# Patient Record
Sex: Female | Born: 1985 | Race: Black or African American | Hispanic: No | Marital: Single | Smoking: Former smoker
Health system: Southern US, Community
[De-identification: ages and names within clinical notes are randomized; demographics above are authoritative.]

## PROBLEM LIST (undated history)

## (undated) DIAGNOSIS — F410 Panic disorder [episodic paroxysmal anxiety] without agoraphobia: Secondary | ICD-10-CM

## (undated) DIAGNOSIS — D649 Anemia, unspecified: Secondary | ICD-10-CM

## (undated) DIAGNOSIS — J45909 Unspecified asthma, uncomplicated: Secondary | ICD-10-CM

## (undated) HISTORY — PX: OTHER SURGICAL HISTORY: SHX169

---
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
## 2004-07-01 ENCOUNTER — Emergency Department (HOSPITAL_COMMUNITY): Admission: EM | Admit: 2004-07-01 | Discharge: 2004-07-01 | Payer: Self-pay | Admitting: Family Medicine

## 2004-07-05 ENCOUNTER — Ambulatory Visit: Payer: Self-pay | Admitting: Obstetrics and Gynecology

## 2004-07-06 ENCOUNTER — Inpatient Hospital Stay (HOSPITAL_COMMUNITY): Admission: AD | Admit: 2004-07-06 | Discharge: 2004-07-06 | Payer: Self-pay | Admitting: *Deleted

## 2004-09-11 ENCOUNTER — Inpatient Hospital Stay (HOSPITAL_COMMUNITY): Admission: AD | Admit: 2004-09-11 | Discharge: 2004-09-11 | Payer: Self-pay | Admitting: *Deleted

## 2004-10-10 ENCOUNTER — Emergency Department (HOSPITAL_COMMUNITY): Admission: EM | Admit: 2004-10-10 | Discharge: 2004-10-11 | Payer: Self-pay | Admitting: Emergency Medicine

## 2004-11-19 ENCOUNTER — Inpatient Hospital Stay (HOSPITAL_COMMUNITY): Admission: AD | Admit: 2004-11-19 | Discharge: 2004-11-19 | Payer: Self-pay | Admitting: Obstetrics and Gynecology

## 2004-12-08 ENCOUNTER — Emergency Department (HOSPITAL_COMMUNITY): Admission: EM | Admit: 2004-12-08 | Discharge: 2004-12-08 | Payer: Self-pay | Admitting: Emergency Medicine

## 2005-01-03 ENCOUNTER — Ambulatory Visit: Payer: Self-pay | Admitting: *Deleted

## 2005-01-03 ENCOUNTER — Inpatient Hospital Stay (HOSPITAL_COMMUNITY): Admission: AD | Admit: 2005-01-03 | Discharge: 2005-01-03 | Payer: Self-pay | Admitting: Obstetrics & Gynecology

## 2005-01-30 ENCOUNTER — Inpatient Hospital Stay (HOSPITAL_COMMUNITY): Admission: AD | Admit: 2005-01-30 | Discharge: 2005-01-30 | Payer: Self-pay | Admitting: Obstetrics and Gynecology

## 2005-01-30 ENCOUNTER — Ambulatory Visit: Payer: Self-pay | Admitting: *Deleted

## 2005-03-05 ENCOUNTER — Inpatient Hospital Stay (HOSPITAL_COMMUNITY): Admission: AD | Admit: 2005-03-05 | Discharge: 2005-03-10 | Payer: Self-pay | Admitting: *Deleted

## 2005-03-05 ENCOUNTER — Ambulatory Visit: Payer: Self-pay | Admitting: Obstetrics and Gynecology

## 2005-04-09 ENCOUNTER — Ambulatory Visit: Payer: Self-pay | Admitting: *Deleted

## 2005-04-09 ENCOUNTER — Inpatient Hospital Stay (HOSPITAL_COMMUNITY): Admission: AD | Admit: 2005-04-09 | Discharge: 2005-04-09 | Payer: Self-pay | Admitting: Family Medicine

## 2005-04-28 ENCOUNTER — Ambulatory Visit (HOSPITAL_COMMUNITY): Admission: AD | Admit: 2005-04-28 | Discharge: 2005-04-28 | Payer: Self-pay | Admitting: Obstetrics & Gynecology

## 2005-05-08 ENCOUNTER — Inpatient Hospital Stay (HOSPITAL_COMMUNITY): Admission: AD | Admit: 2005-05-08 | Discharge: 2005-05-11 | Payer: Self-pay | Admitting: Obstetrics & Gynecology

## 2005-07-04 ENCOUNTER — Emergency Department (HOSPITAL_COMMUNITY): Admission: EM | Admit: 2005-07-04 | Discharge: 2005-07-04 | Payer: Self-pay | Admitting: Emergency Medicine

## 2006-01-05 ENCOUNTER — Emergency Department (HOSPITAL_COMMUNITY): Admission: EM | Admit: 2006-01-05 | Discharge: 2006-01-05 | Payer: Self-pay | Admitting: Emergency Medicine

## 2006-04-15 IMAGING — CR DG CHEST 2V
2 series · 2 of 2 positions shown · non-contrast
Comparison: None

CLINICAL DATA: 34 weeks pregnant. Flulike symptoms, fever, question pneumonia

CHEST - 2 VIEW:

[view not recorded (1 of 2)]
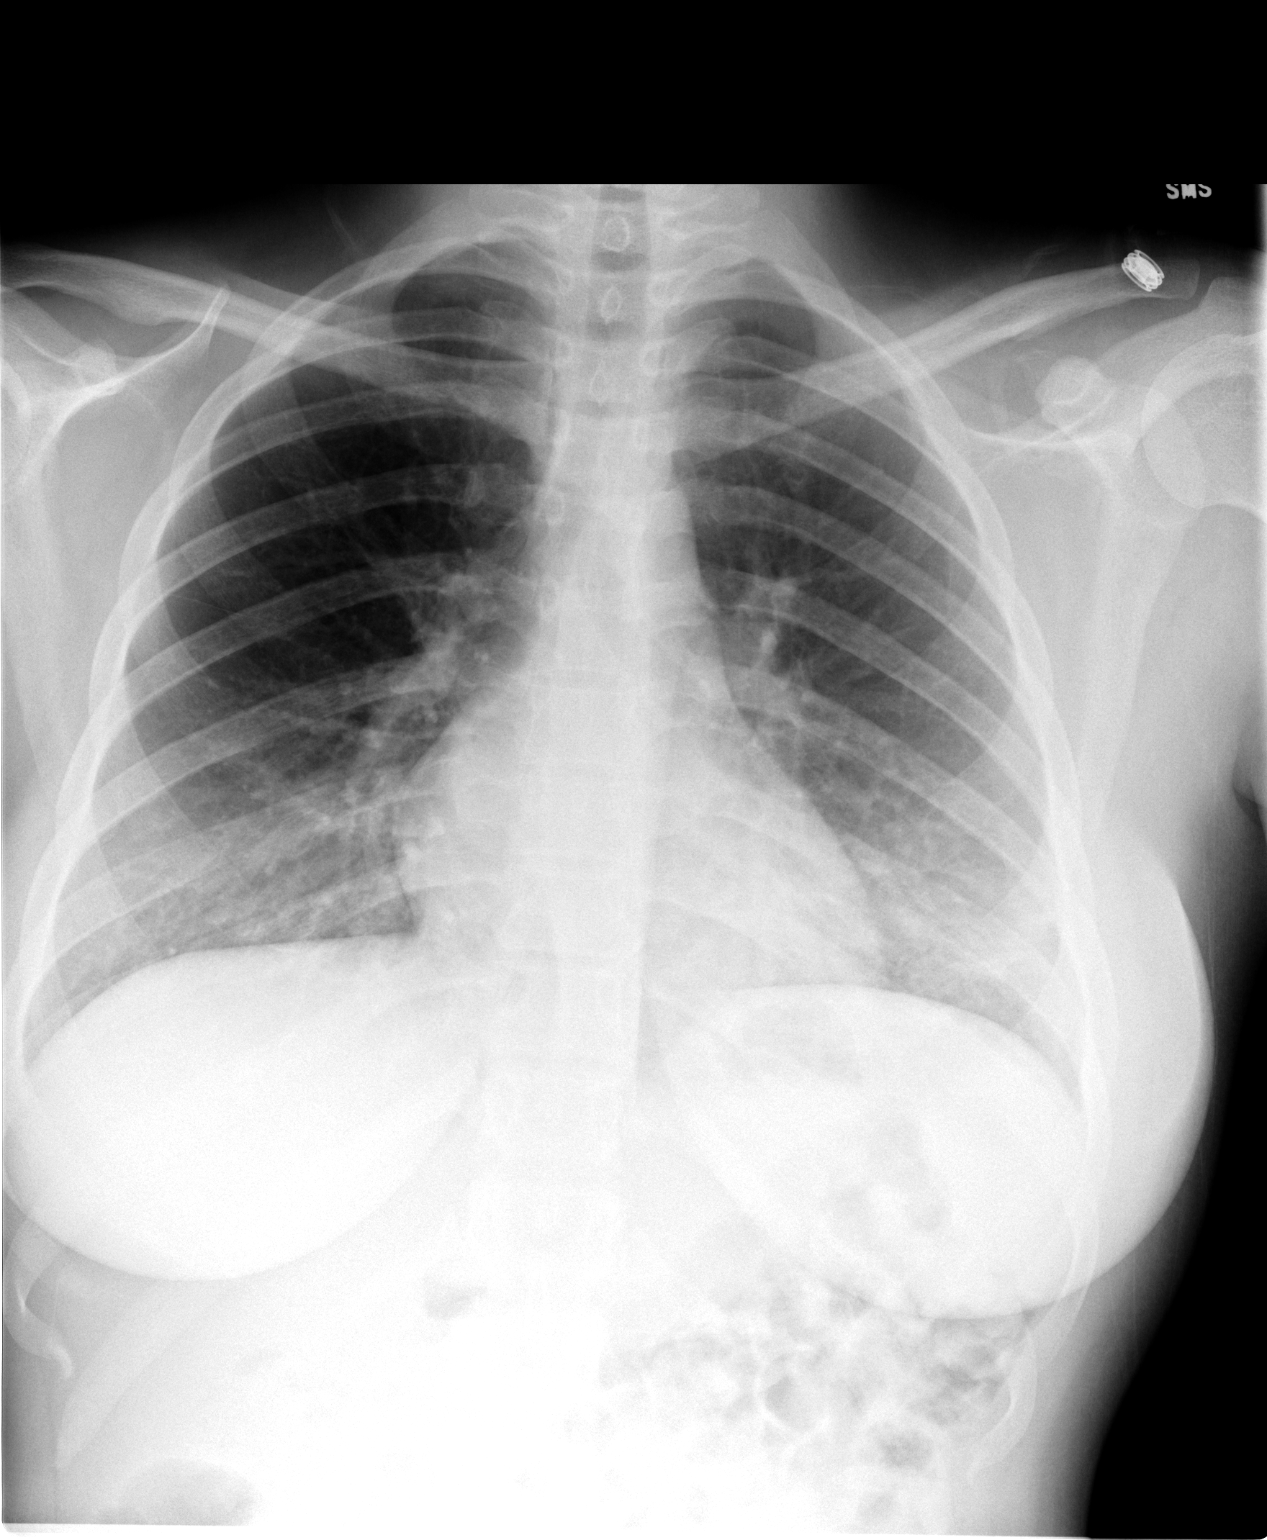

[view not recorded (2 of 2)]
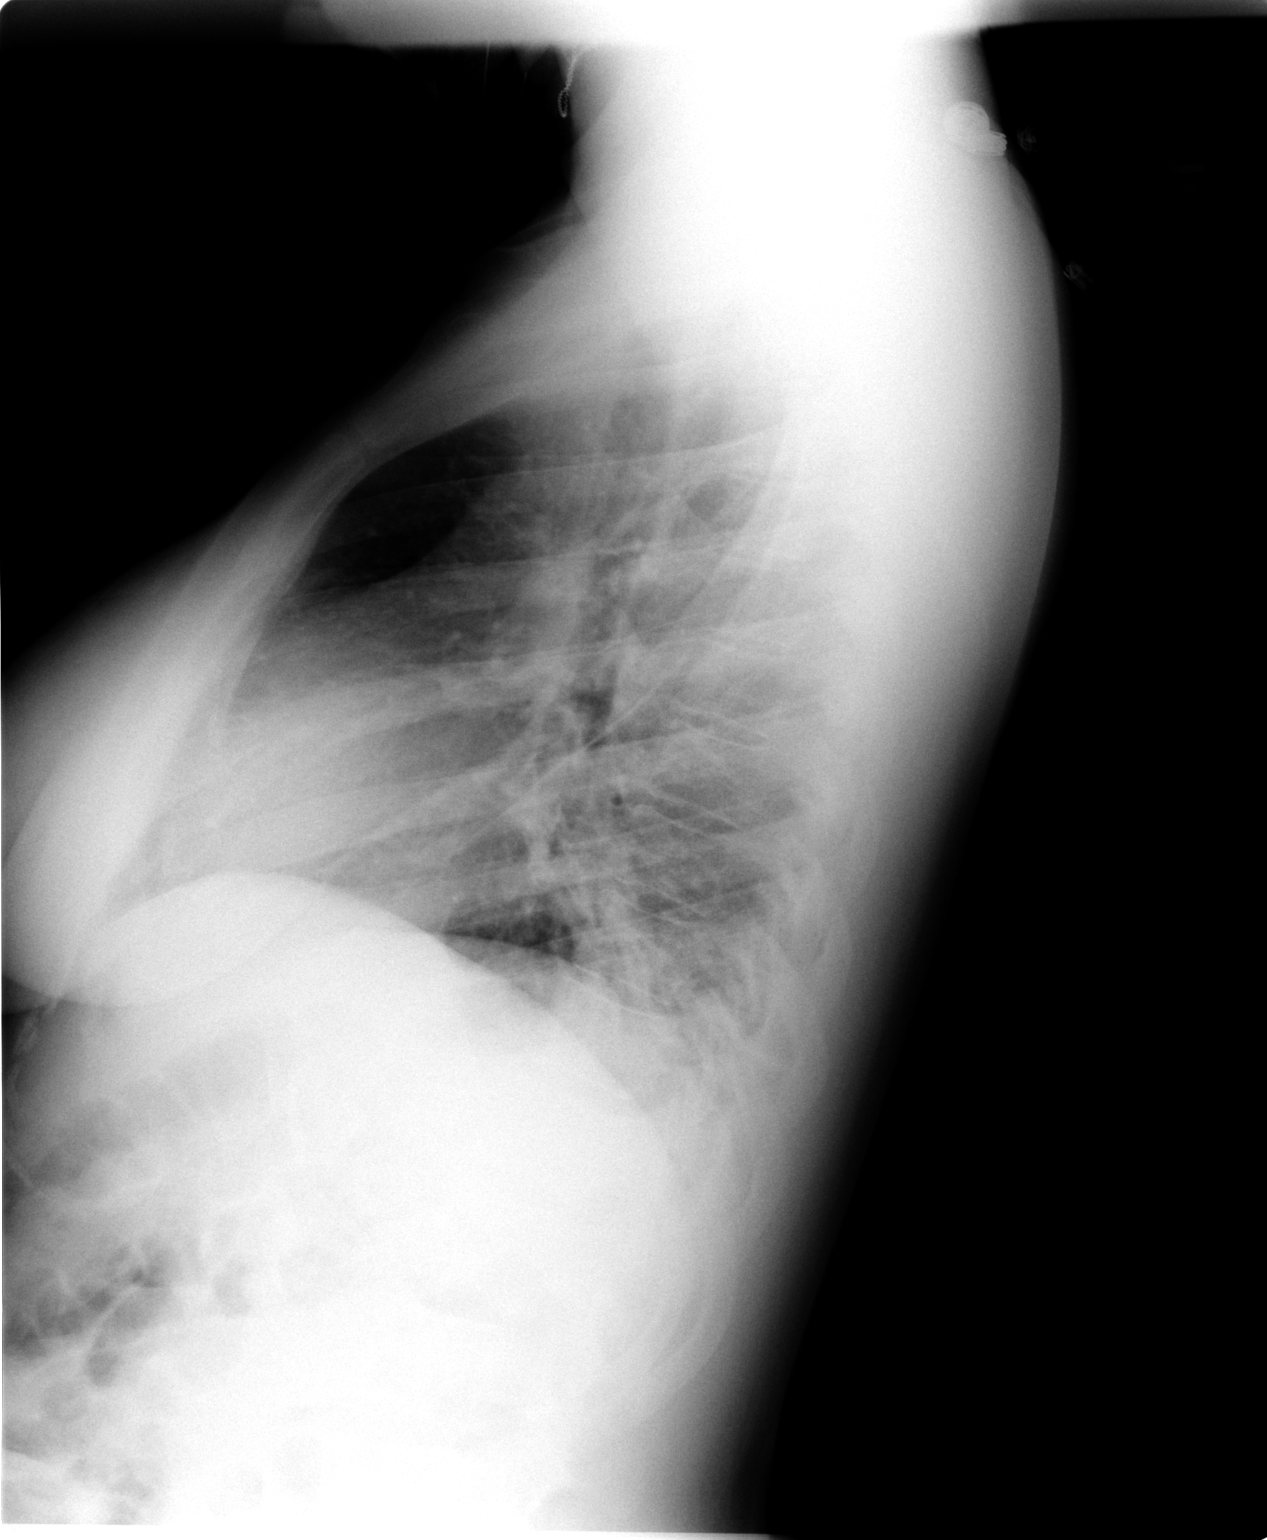

[2 of 2 positions shown; findings below may reference images not displayed]

FINDINGS: Heart and mediastinal contours are within normal limits. There are
increased markings in the bases bilaterally, but more confluent opacity noted in
the left lower lobe posteriorly, question early left lower lobe pneumonia. No
effusions.
IMPRESSION: Increased markings in the bases, but somewhat more confluent opacity in the left
lower lobe posteriorly, question early pneumonia.

## 2006-07-25 ENCOUNTER — Emergency Department (HOSPITAL_COMMUNITY): Admission: EM | Admit: 2006-07-25 | Discharge: 2006-07-25 | Payer: Self-pay | Admitting: *Deleted

## 2006-08-06 ENCOUNTER — Emergency Department (HOSPITAL_COMMUNITY): Admission: EM | Admit: 2006-08-06 | Discharge: 2006-08-07 | Payer: Self-pay | Admitting: Emergency Medicine

## 2008-04-03 ENCOUNTER — Emergency Department (HOSPITAL_COMMUNITY): Admission: EM | Admit: 2008-04-03 | Discharge: 2008-04-03 | Payer: Self-pay | Admitting: Emergency Medicine

## 2008-06-26 ENCOUNTER — Emergency Department (HOSPITAL_COMMUNITY): Admission: EM | Admit: 2008-06-26 | Discharge: 2008-06-26 | Payer: Self-pay | Admitting: Emergency Medicine

## 2008-10-31 ENCOUNTER — Emergency Department (HOSPITAL_COMMUNITY): Admission: EM | Admit: 2008-10-31 | Discharge: 2008-10-31 | Payer: Self-pay | Admitting: Emergency Medicine

## 2008-11-02 ENCOUNTER — Emergency Department (HOSPITAL_COMMUNITY): Admission: EM | Admit: 2008-11-02 | Discharge: 2008-11-02 | Payer: Self-pay | Admitting: Emergency Medicine

## 2009-05-07 ENCOUNTER — Emergency Department (HOSPITAL_COMMUNITY): Admission: EM | Admit: 2009-05-07 | Discharge: 2009-05-07 | Payer: Self-pay | Admitting: Emergency Medicine

## 2009-07-18 ENCOUNTER — Emergency Department (HOSPITAL_COMMUNITY): Admission: EM | Admit: 2009-07-18 | Discharge: 2009-07-18 | Payer: Self-pay | Admitting: Emergency Medicine

## 2010-04-12 ENCOUNTER — Emergency Department (HOSPITAL_COMMUNITY)
Admission: EM | Admit: 2010-04-12 | Discharge: 2010-04-12 | Disposition: A | Discharge status: Home / Self Care | Payer: Self-pay | Attending: Emergency Medicine | Admitting: Emergency Medicine

## 2010-04-12 DIAGNOSIS — K5289 Other specified noninfective gastroenteritis and colitis: Secondary | ICD-10-CM | POA: Insufficient documentation

## 2010-04-12 DIAGNOSIS — R6883 Chills (without fever): Secondary | ICD-10-CM | POA: Insufficient documentation

## 2010-04-12 LAB — URINALYSIS, ROUTINE W REFLEX MICROSCOPIC
Bilirubin Urine: NEGATIVE
Hgb urine dipstick: NEGATIVE
Ketones, ur: NEGATIVE mg/dL
Leukocytes, UA: NEGATIVE
Specific Gravity, Urine: 1.015 (ref 1.005–1.030)
Urine Glucose, Fasting: NEGATIVE mg/dL
pH: 8.5 — ABNORMAL HIGH (ref 5.0–8.0)

## 2010-04-12 LAB — CBC
HCT: 39 % (ref 36.0–46.0)
MCHC: 34.9 g/dL (ref 30.0–36.0)
Platelets: 224 10*3/uL (ref 150–400)
RBC: 4.28 MIL/uL (ref 3.87–5.11)
RDW: 12.7 % (ref 11.5–15.5)
WBC: 8.6 10*3/uL (ref 4.0–10.5)

## 2010-04-12 LAB — DIFFERENTIAL
Basophils Absolute: 0 10*3/uL (ref 0.0–0.1)
Eosinophils Relative: 1 % (ref 0–5)
Lymphocytes Relative: 13 % (ref 12–46)
Lymphs Abs: 1.2 10*3/uL (ref 0.7–4.0)
Monocytes Absolute: 0.4 10*3/uL (ref 0.1–1.0)
Neutro Abs: 6.9 10*3/uL (ref 1.7–7.7)
Neutrophils Relative %: 81 % — ABNORMAL HIGH (ref 43–77)

## 2010-04-12 LAB — URINE MICROSCOPIC-ADD ON

## 2010-04-12 LAB — BASIC METABOLIC PANEL
BUN: 10 mg/dL (ref 6–23)
CO2: 27 mEq/L (ref 19–32)
Calcium: 9.5 mg/dL (ref 8.4–10.5)
Chloride: 103 mEq/L (ref 96–112)
Creatinine, Ser: 0.84 mg/dL (ref 0.4–1.2)
GFR calc Af Amer: >60 mL/min (ref >60)
GFR calc non Af Amer: >60 mL/min (ref >60)
Glucose, Bld: 89 mg/dL (ref 70–99)
Potassium: 4 mEq/L (ref 3.5–5.1)
Sodium: 138 mEq/L (ref 135–145)

## 2010-04-12 LAB — GLUCOSE, CAPILLARY: Glucose-Capillary: 90 mg/dL (ref 70–99)

## 2010-04-25 ENCOUNTER — Emergency Department (HOSPITAL_COMMUNITY)
Admission: EM | Admit: 2010-04-25 | Discharge: 2010-04-25 | Disposition: A | Discharge status: Home / Self Care | Payer: Self-pay | Attending: Emergency Medicine | Admitting: Emergency Medicine

## 2010-04-25 DIAGNOSIS — R229 Localized swelling, mass and lump, unspecified: Secondary | ICD-10-CM | POA: Insufficient documentation

## 2010-04-25 DIAGNOSIS — R209 Unspecified disturbances of skin sensation: Secondary | ICD-10-CM | POA: Insufficient documentation

## 2010-05-25 LAB — URINE MICROSCOPIC-ADD ON

## 2010-05-25 LAB — PREGNANCY, URINE: Preg Test, Ur: NEGATIVE

## 2010-05-25 LAB — URINALYSIS, ROUTINE W REFLEX MICROSCOPIC
Glucose, UA: NEGATIVE mg/dL
Ketones, ur: 15 mg/dL — AB
Leukocytes, UA: NEGATIVE
Nitrite: NEGATIVE
Protein, ur: 30 mg/dL — AB
Specific Gravity, Urine: >1.03 — ABNORMAL HIGH (ref 1.005–1.030)
Urobilinogen, UA: 0.2 mg/dL (ref 0.0–1.0)
pH: 6 (ref 5.0–8.0)

## 2010-06-09 LAB — CULTURE, ROUTINE-ABSCESS

## 2010-06-13 LAB — PREGNANCY, URINE: Preg Test, Ur: NEGATIVE

## 2010-06-13 LAB — URINALYSIS, ROUTINE W REFLEX MICROSCOPIC
Bilirubin Urine: NEGATIVE
Glucose, UA: NEGATIVE mg/dL
Ketones, ur: NEGATIVE mg/dL
Leukocytes, UA: NEGATIVE
Nitrite: NEGATIVE
Protein, ur: NEGATIVE mg/dL
Specific Gravity, Urine: 1.01 (ref 1.005–1.030)
Urobilinogen, UA: 0.2 mg/dL (ref 0.0–1.0)
pH: 6.5 (ref 5.0–8.0)

## 2010-06-13 LAB — URINE MICROSCOPIC-ADD ON

## 2010-06-13 LAB — BASIC METABOLIC PANEL
BUN: 8 mg/dL (ref 6–23)
CO2: 30 mEq/L (ref 19–32)
Calcium: 9.2 mg/dL (ref 8.4–10.5)
Chloride: 104 mEq/L (ref 96–112)
Creatinine, Ser: 0.75 mg/dL (ref 0.4–1.2)
GFR calc Af Amer: >60 mL/min (ref >60)
GFR calc non Af Amer: >60 mL/min (ref >60)
Glucose, Bld: 83 mg/dL (ref 70–99)
Potassium: 3.9 mEq/L (ref 3.5–5.1)
Sodium: 138 mEq/L (ref 135–145)

## 2010-06-13 LAB — CBC
HCT: 37.3 % (ref 36.0–46.0)
Hemoglobin: 12.9 g/dL (ref 12.0–15.0)
MCHC: 34.5 g/dL (ref 30.0–36.0)
MCV: 92.5 fL (ref 78.0–100.0)
Platelets: 242 10*3/uL (ref 150–400)
RBC: 4.03 MIL/uL (ref 3.87–5.11)
RDW: 13.2 % (ref 11.5–15.5)
WBC: 5.4 10*3/uL (ref 4.0–10.5)

## 2010-06-13 LAB — ABO/RH: ABO/RH(D): B POS

## 2010-06-13 LAB — HCG, QUANTITATIVE, PREGNANCY: hCG, Beta Chain, Quant, S: <2 m[IU]/mL (ref <5)

## 2010-07-20 NOTE — H&P (Signed)
NAMESHANTAY, SONN                        ACCOUNT NO.:  [id number]   MEDICAL RECORD NO.:  [id number]                  PATIENT TYPE:   LOCATION:                                       FACILITY:   PHYSICIAN:  Lazaro Arms, M.D.                DATE OF BIRTH:  04-Feb-1986   DATE OF ADMISSION:  04/20/2003  DATE OF DISCHARGE:                                HISTORY & PHYSICAL   CHIEF COMPLAINT:  Induction of labor due to extreme discomfort.   HISTORY OF PRESENT ILLNESS:  Kathy Robertson is an 25 year old, gravida 4, para 2,  AB 1, living 2, with an EDC of April 27, 2003, based on a 30-week  ultrasound.  She was taking Ovcon 35 when she unknowing became pregnant and  began prenatal care at approximately [redacted] weeks gestation when she felt the  baby move.  She has had regular visits since beginning care.  Blood  pressures have been in the 100s-120s/50s-70s.   PRENATAL LABORATORY DATA:  Blood type B positive.  Rubella is immune.  HBSAG, HIV, RPR, gonorrhea, chlamydia, and GBS are all negative.  Sickle  cell screen is also negative.  One-hour GTT was normal at 79.  Total weight  gain has been 2 pounds since beginning care with appropriate fundal height  growth.   PAST MEDICAL HISTORY:  Noncontributory.   PAST SURGICAL HISTORY:  Noncontributory.   ALLERGIES:  No known drug allergies.   SOCIAL HISTORY:  She is single.  The father of the baby is supportive.  This  is the same father of all of her babies.  She is a Holiday representative at [employer].   OBSTETRICAL HISTORY:  Spontaneous AB in 2001.  In 2003, she had a vaginal  delivery of a 6 pound 10 ounce female at 40 weeks.  In 2004, she had a  spontaneous vaginal delivery of a 6 pound 6 ounce female at 42 weeks with no  complications.   PHYSICAL EXAMINATION:  HEENT:  Within normal limits.  LUNGS:  Clear.  HEART:  Regular rate and rhythm.  ABDOMEN:  Soft and nontender.  Fundal height 39 cm.  Estimated weight about  6 pounds.  PELVIC:   The cervix is 2 cm, soft, long, and -2 station.  Vertex  presentation.  EXTREMITIES:  The legs are negative.   IMPRESSION:  Intrauterine pregnancy at 39 weeks.  Elective induction of  labor due to extreme maternal discomfort.  The risks have been explained to  the patient in that the same risks apply with induction of labor as you have  with spontaneous labor.  She accepts these risks, which is to proceed with  the elective induction of labor.     _____________________________________  ___________________________________________  Jacklyn Shell, C.N.M.        Lazaro Arms, M.D.   FC/MEDQ  D:  04/19/2003  T:  04/19/2003  Job:  [id number]  cc:   [facility]

## 2010-07-20 NOTE — H&P (Signed)
NAMEMERCADES, BAJAJ              ACCOUNT NO.:  [id number]   MEDICAL RECORD NO.:  [id number]          PATIENT TYPE:  INP   LOCATION:  A411                          FACILITY:  [facility]   PHYSICIAN:  Tilda Burrow, M.D. DATE OF BIRTH:  Jun 23, 1985   DATE OF ADMISSION:  05/08/2005  DATE OF DISCHARGE:  03/10/2007LH                                HISTORY & PHYSICAL   ADMITTING DIAGNOSES:  1.  Pregnancy, [redacted] weeks gestation.  2.  Medically indicated induction of labor.  3.  Minimal prenatal care this pregnancy.   HISTORY OF PRESENT ILLNESS:  This 25 year old female, gravida 5, para 3,  AB1, three living children, was admitted to [facility] for  induction of labor at 41 weeks by the best criteria available.  The patient  has sought minimal prenatal care this pregnancy and was then seen three  times, just visits to [facility] or [facility].  Records  available include blood type B-positive, urine drug screen negative,  hemoglobin 9, hematocrit 27 in November 2006, hepatitis, HIV, RPR, GC and  Chlamydia all negative.  Glucose _tolerance test__ 109 mg%.  The patient is  41 weeks 1 day by best available criteria.  This included an ultrasound at  [redacted] weeks gestation performed at [facility].  The prenatal records had  been obtained from February 2007 at [facility] where patient had a  prenatal assessment.  The pregnancy was notable for spotting x1 episode.   PAST MEDICAL HISTORY:  Benign.   RISK FACTORS IDENTIFIED DURING PREGNANCY:  1.  Need for influenza injection.  2.  History of THC use identified at initial assessment.   PHYSICAL EXAMINATION ON ADMISSION:  A term-sized fetus in the vertex  presentation.  The cervix 2 cm thick and high, vertex presentation when  scheduled for admission after assessment on May 07, 2005.  Admitting weight  179, blood pressure 110/60.  General examination when admitted and assessed  by Zerita Boers, CNM, includes  term-sized fetus, vertex presentation with  cervical exam showing the cervix to be 2 cm.   PLAN:  __foley Bulb__ cervical ripening, Pitocin induction of labor.      Tilda Burrow, M.D.  Electronically Signed     JVF/MEDQ  D:  06/30/2005  T:  06/30/2005  Job:  [id number]

## 2010-07-20 NOTE — Discharge Summary (Signed)
NAMEFRANCYS, Kathy Robertson              ACCOUNT NO.:  [id number]   MEDICAL RECORD NO.:  [id number]          PATIENT TYPE:  INP   LOCATION:  9307                          [facility]ACILITY:  [facility]   PHYSICIAN:  Conni Elliot, M.D.DATE OF BIRTH:  07-22-1985   DATE OF ADMISSION:  03/05/2005  DATE OF DISCHARGE:  03/10/2005                                 DISCHARGE SUMMARY   LABORATORY AND RADIOLOGY STUDIES:  White blood cell count on admission 7.5,  hemoglobin 10.0, platelets 195.  On the white count, there was an 85%  neutrophil.  Urine specific gravity 1.025, negative urine glucose, negative  nitrite, trace leukocyte, 0-2 white blood cells per high power field, rare  bacteria.  Microbiology revealed 20,000 colony forming units of E coli, pan  sensitive.  Influenza A positive, insulin B negative.  Sodium 138, potassium  3.9, glucose 87, bicarbonate 25, BUN 6, creatinine 0.7.  AST 11, ALT less  than 8.  Albumin 2.0.  LDH 107.  Uric acid 5.8.   Chest x-ray on March 06, 2005 revealed increased markings in the bases,  somewhat more confluent on the left lower lobe posteriorly with a question  of early pneumonia.   SUMMARY OF HOSPITAL COURSE:  This is a 25 year old G5, P3, 0-1-3, who  presented at 32 weeks with a viral syndrome.  Initially chest x-ray revealed  questionable pneumonia, that was felt to be viral versus secondary bacterial  after influenza A came back.  We started Tamiflu on March 06, 2005 and  then began Rocephin and azithromycin for the possibility of a secondary  bacterial pneumonia.  We also were giving the patient nebulized albuterol,  Atrovent.  The patient starting to have worsening oxygen saturations and  requiring larger amounts of oxygen and becoming slightly tachypneic, so she  was transferred for a very short time to the intensive care unit for closer  monitoring.  She quickly resolved after this difficulty and subsequently was  afebrile for 48 hours before discharge and  was no longer requiring any  oxygen and her saturation was 100% on room air, no longer requiring  nebulization, was taking p.o. without difficulty and asking to go home.  Throughout her admission, fetal heart tracings revealed to have fetus in no  distress and with good variability and a reactive pattern of the heart  tracing.   ACTIVITY:  Increase as tolerated.   DIET:  Increase as tolerated, should emphasize clear liquids.   FOLLOWUP CARE:  As it is Sunday, we cannot make an appointment for her, but  we have given her the telephone number and recommend that she make an  appointment at [facility], where she gets her care for this coming  Wednesday, March 13, 2005, to follow up for respiratory difficulty,  pneumonia, make sure she finishes she antibiotics and make sure she  continues to improve.      Angeline Slim, M.D.    ______________________________  Conni Elliot, M.D.   AL/MEDQ  D:  03/10/2005  T:  03/11/2005  Job:  [id number]

## 2010-07-20 NOTE — Op Note (Signed)
[facility]  Patient:    Kathy Robertson, Kathy Robertson Visit Number: [id number] MRN: [id number]          Service Type: OBS Location: 4A A420 01 Attending Physician:  Tilda Burrow Dictated by:   Zerita Boers, N.M. Admit Date:  03/23/2001 Discharge Date: 03/25/2001                             Operative Report  DELIVERY SUMMARY  Onset of labor:  March 23, 2001, at 9 a.m. Date of delivery:  March 23, 2001, at 1540 hours. Length of first-stage labor:  5 hours 25 minutes. Length of second-stage labor:  1 hour 12 minutes. Length of third-stage labor:  Five minutes.  DELIVERY NOTE:  Inda Coke had a normal spontaneous vaginal delivery of a viable female infant.  Upon delivery of the head, a tight nuchal cord was noted x 1, which was loosened, and the infant slipped through without any difficulty. Infant Apgars were 8 and 9.  Twenty units of Pitocin was used to manage third stage of labor.  Placenta was delivered via Duncans mechanism.  A three-vessel cord was noted, and membranes were intact.  Estimated blood loss was approximately 300 cc.  Perineum was intact upon inspection.  Infant and mother were stabilized in good condition without any complications and transferred up to the postpartum unit. Dictated by:   Zerita Boers, N.M. Attending Physician:  Tilda Burrow DD:  03/23/01 TD:  03/24/01 Job: [id number]

## 2010-07-20 NOTE — H&P (Signed)
NAME:  Kathy Robertson, Kathy Robertson                        ACCOUNT NO.:  [id number]   MEDICAL RECORD NO.:  [id number]                   PATIENT TYPE:  INP   LOCATION:  LDR1                                 FACILITY:  [facility]   PHYSICIAN:  Tilda Burrow, M.D.              DATE OF BIRTH:  Jun 25, 1985   DATE OF ADMISSION:  05/30/2002  DATE OF DISCHARGE:                                HISTORY & PHYSICAL   ADMITTING DIAGNOSIS:  Pregnancy, 41 weeks' gestation; latent phase labor.   HISTORY OF PRESENT ILLNESS:  This 25 year old female, gravida 3, para 1,  Ab1, LMP ? mid-May 2003, which placed menstrual EDC April 23, 2002, with  ultrasound-assigned [facility] of May 19, 2002, based on 18-week 5-day ultrasound  on December 21, 2001, is admitted at 41 weeks by that single late criteria.  The patient presented at approximately 5 p.m. to labor and delivery on  Sunday, May 30, 2002, complaining of irregular uterine contractions.  She  was found to be 3-cm dilated by Dr. Langley Gauss, covering physician, and  was admitted.  After assumption of care by me, we progressed slowly through  the night.  Labor was not augmented initially.  She has remained with  minimal contractions through the night and has a cervix that remains 3 cm,  uneffaced, -2 station with vertex presentation confirmed.  At 5:30 a.m.,  fetal heart beat-to-beat shows excellent reactivity.  Pitocin augmentation  is initiated.  Prognosis for vaginal delivery is considered good, given that  she has delivered vaginally once before after a 6-hour labor, delivering a 6-  pound 10-ounce infant.   PAST MEDICAL HISTORY:  Benign.   SURGICAL HISTORY:  Negative.   ALLERGIES:  No known drug allergies.   CONTRACEPTION:  Conceived on NuvaRing.   PHYSICAL EXAMINATION:  VITAL SIGNS:  Height 5 feet 7 inches.  Weight 215.  Blood pressure 139/76, temperature 98.4, respirations 18, pulse 85.  GENERAL:  Prenatal general exam showed a large-framed African  American  female, alert and oriented x3.  HEENT:  Pupils equal, round and reactive.  CARDIOVASCULAR:  Exam unremarkable.  ABDOMEN:  Abdominal height is 40-cm fundal height.  PELVIC:  Cervix 3, 0%, -2.   PRENATAL LABORATORY DATA:  Blood type B-positive; UDS negative; hemoglobin  12, hematocrit 36; hepatitis, HIV, GC, Chlamydia all negative; group B strep  positive with prior pregnancy.    ASSESSMENT:  1. Pregnancy, 41 weeks, by late gestational criteria, latent phase labor.  2. 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   PLAN:  Pitocin augmentation of labor, amniotomy when cervical dilation has  been achieved.                                               Tilda Burrow, M.D.    JVF/MEDQ  D:  05/31/2002  T:  05/31/2002  Job:  [id number]

## 2010-07-20 NOTE — Op Note (Signed)
   NAMEREBIE, PEALE                        ACCOUNT NO.:  [id number]   MEDICAL RECORD NO.:  [id number]                   PATIENT TYPE:  INP   LOCATION:  LDR1                                 FACILITY:  [facility]   PHYSICIAN:  Tilda Burrow, M.D.              DATE OF BIRTH:  [birth year]   DATE OF PROCEDURE:  05/31/2002  DATE OF DISCHARGE:                                  PROCEDURE NOTE   ONSET OF LABOR:  May 31, 2002, at 5 a.m.   DATE OF DELIVERY:  May 31, 2002, at 11:37 a.m.   LENGTH OF FIRST STAGE OF LABOR:  7 hours and 35 minutes.   LENGTH OF SECOND STAGE OF LABOR:  Two minutes.   LENGTH OF THIRD STAGE OF LABOR:  Three minutes.   DELIVERY NOTE:  The patient had a normal spontaneous vaginal delivery of a  viable female infant.  Upon delivery of the head, the nose and mouth were  thoroughly suctioned.  Spontaneous delivery of shoulders without  complications.  Upon delivery, the infant had a strong cry, did move all  extremities, and pinked up well.  The infant was dried.  The cord was  clamped and cut.  The infant was given to the nursery nurse for further  drying.  The third stage of labor was actively managed with 20 units of  Pitocin and 1000 mL of D5 LR at a rapid rate.  Upon inspection, the perineum  was noted to be intact.  The placenta was delivered spontaneously via  Tomasa Blase mechanism.  A three-vessel cord was noted upon inspection.  Membranes were noted to be intact upon inspection.  The estimated blood loss  was approximately 300 mL.  The infant and mother were both stabilized in  good condition.  Transferred out to the postpartum unit.     Zerita Boers, Reita Cliche, M.D.    DL/MEDQ  D:  16/12/9602  T:  05/31/2002  Job:  [id number]   cc:   [facility]

## 2010-07-20 NOTE — Group Therapy Note (Signed)
[name], [name]              ACCOUNT NO.:  [id number]   MEDICAL RECORD NO.:  [id number]          PATIENT TYPE:  WOC   LOCATION:  [facility]                   [facility]AC[facility]   PHYSICIAN:  Argentina Donovan, MD        DATE OF BIRTH:  09-Jun-1985   DATE OF SERVICE:  07/05/2004                                    CLINIC NOTE   REASON FOR VISIT:  The patient is a 25 year old gravida 3 para 3-0-0-3 who  had her last baby a year ago February and had a Mirena IUD put in place at  that time. The patient became amenorrheic 2 months ago and started having  severe lower abdominal pain consistently. She has an acutely retroverted  uterus and wants the IUD removed. She used the NuvaRing before and got  pregnant using that. We are going to substitute the oral contraceptives,  i.e. Ortho Tri-Cyclen Lo for the IUD when it is taken out. Instructions have  been given to her how to use it. Cervical cultures and Pap smear were taken  today.   EXAMINATION:  The abdomen was soft, flat, nontender. No masses, no  organomegaly. External genitalia is normal, BUS within normal limits. The  vagina is clean and well rugated. The cervix was clean and far anterior in  the vagina. The uterus is acutely retroverted and of normal size, shape,  consistency. The adnexa is normal. There is no pain on examination. The IUD  was removed with a ring forceps without incident and easily.   A prescription for Ortho Tri-Cyclen for 1 year has been given to the  patient. She has been instructed if pain persists to come back.      PR/MEDQ  D:  07/05/2004  T:  07/05/2004  Job:  [id number]

## 2010-07-20 NOTE — Op Note (Signed)
NAME:  Kathy Robertson, Kathy Robertson                        ACCOUNT NO.:  [id number]   MEDICAL RECORD NO.:  [id number]                   PATIENT TYPE:  INP   LOCATION:  LDR1                                 FACILITY:  [facility]   PHYSICIAN:  Lazaro Arms, M.D.                DATE OF BIRTH:  1985/09/25   DATE OF PROCEDURE:  DATE OF DISCHARGE:                                 OPERATIVE REPORT   I was called to attend the delivery at 10:21 a.m.  Immediately upon arriving  to the floor, Any crowned and with one push delivered a viable female  infant at 10:24.  The mouth and nose were suctioned with a bulb syringe,  spontaneous cry and good heart rate.  Apgar were 9 & 9, weight 6 pounds, 1  ounce.  Twenty units of Pitocin diluted in 1000 mL of lactated ringers was  infused rapidly IV.  The placenta separated spontaneously and delivered via  controlled cord traction at 10:28.  It was inspected and appears to be  intact with a three-vessel cord. The vagina was then inspected and no  lacerations were found.  Estimated blood loss 250 mL.     ________________________________________  ___________________________________________  Jacklyn Shell, C.N.M.           Lazaro Arms, M.D.   FC/MEDQ  D:  04/21/2003  T:  04/21/2003  Job:  [id number]

## 2010-07-20 NOTE — H&P (Signed)
[facility]  Patient:    Kathy Robertson, Kathy Robertson Visit Number: [id number] MRN: [id number]          Service Type: OBS Location: 4A A417 01 Attending Physician:  Tilda Burrow Dictated by:   Zerita Boers, N.M. Admit Date:  03/23/2001   CC:         [facility]   History and Physical  REASON FOR ADMISSION:  Active labor.  HISTORY OF PRESENT ILLNESS:  Kathy Robertson was seen last night in labor and delivery with irregular contractions and no cervical change.  Was sent home. During the course of the night, contractions became more regular.  She presented in the office today, was assessed by Dr. Despina Hidden, and the cervix was 5-6 cm dilated.  The patient at that time was sent to labor and delivery.  PAST MEDICAL HISTORY:  Negative.  PAST SURGICAL HISTORY:  Negative.  ALLERGIES:  No known allergies.  SOCIAL HISTORY:  She lives with her mother, and she is present and supportive.  PRENATAL COURSE:  Essentially uneventful.  Blood type is B positive.  UDS is negative.  Rubella is immune.  Hepatitis B surface antigen is negative.  HIV is negative.  Pap is within normal limits.  GC and chlamydia are both negative.  MSAFP was normal.  GBS was positive.  A 28-week hemoglobin 11.0, a 28-week hematocrit 33.8.  One-hour glucose was 53.  Sickle cell screen was negative.  PHYSICAL EXAMINATION:  VITAL SIGNS:  Today the patient is not weighed.  On March 17, 2001, she was 216 pounds.  Blood pressure 140/84.  ABDOMEN:  Fetal heart tones 140, strong, and regular.  PELVIC:  Cervix 5-6 cm.  Membranes are bulging.  PLAN:  The patient was sent to labor and delivery.  We are going to admit, and expect vaginal delivery. Dictated by:   Zerita Boers, N.M. Attending Physician:  Tilda Burrow DD:  03/23/01 TD:  03/23/01 Job: [id number]

## 2010-07-20 NOTE — H&P (Signed)
NAME:  Kathy Robertson, Kathy Robertson                        ACCOUNT NO.:  [id number]   MEDICAL RECORD NO.:  [id number]                   PATIENT TYPE:  INP   LOCATION:  LDR1                                 FACILITY:  [facility]   PHYSICIAN:  Langley Gauss, M.D.                DATE OF BIRTH:  01-25-1986   DATE OF ADMISSION:  05/30/2002  DATE OF DISCHARGE:                                HISTORY & PHYSICAL   HISTORY OF PRESENT ILLNESS:  The patient is a 25 year old gravida 2 para 1  at 41-1/[redacted] weeks gestation by a 22-week ultrasound who presents to [facility] this evening with complaints of uterine contractions and noted to  be in early labor.  The patient denies any rupture of membranes.  She denies  any vaginal bleeding.  The patient's prenatal course has been complicated by  her failure to be compliant with requests for a prenatal II panel, likewise,  the patient is noted to have a history of a previous pregnancy at which time  she was noted to be a group B strep carrier status positive patient thus,  she required treatment during this course of labor.   PAST MEDICAL HISTORY:  The patient's past medical history is otherwise  negative.   PAST OBSTETRICAL HISTORY:  She has one prior vaginal delivery without  complications of a 6-pound 10-ounce infant after a 6-hour labor.  The  patient is noted to have a spontaneous AB x1 2001.   ALLERGIES:  She has no known drug allergies.   PHYSICAL EXAMINATION:  GENERAL:  Black female in no acute distress.  VITAL SIGNS:  Blood pressure is 135/70, pulse rate of 80, respiratory rate  is 20.  HEAD AND NECK:  HEENT is negative.  No adenopathy.  Neck is supple.  Thyroid  is nonpalpable.  Mucous membranes are moist.  LUNGS:  Clear.  CARDIOVASCULAR:  Regular rate and rhythm.  ABDOMEN:  Soft and nontender.  No surgical scars are identified.  Fundal  height is noted to be 40 cm.  She is vertex presentation by Leopold's  maneuver.  EXTREMITIES:  Normal.  PELVIC:  Normal external genitalia.  No leakage of fluid identified.  No  vaginal lesions identified.  Cervix noted to be 3 cm dilated, -2 station,  60% effaced and vertex presentation confirmed.  External fetal monitor  reveals a reassuring fetal heart rate with fetal heart rate baseline of 150.  There is known to be accelerations noted and greater than 15 beats per  minute x greater than 15 seconds duration.  No fetal heart rate  decelerations are noted.  Uterine contractions initially q.3-83m. after  initiation of IV fluids now q.5-68m., mild in intensity.   ASSESSMENT:  A 41-1/2 week-intrauterine pregnancy by a 22-week ultrasound.  The patient presents in early stages of labor with decreased uterine  contraction frequency and intensity after receiving intravenous fluids.  Vertex noted to be  ballottable and not engaged on initial examination with  the vertex at a -2 station.    PLAN:  The patient admitted at this time.  She will be treated with IV  ampicillin during her course of labor due to her previous pregnancies at  which time she was noted to be a carrier of group B strep.  The patient's  pelvis is noted to be clinically adequate.                                               Langley Gauss, M.D.    DC/MEDQ  D:  05/30/2002  T:  05/30/2002  Job:  [id number]   cc:   [facility]

## 2010-07-20 NOTE — Group Therapy Note (Signed)
NAMEDAISA, STENNIS              ACCOUNT NO.:  [id number]   MEDICAL RECORD NO.:  [id number]          PATIENT TYPE:  INP   LOCATION:  A411                          FACILITY:  [facility]   PHYSICIAN:  Tilda Burrow, M.D. DATE OF BIRTH:  [birth year]   DATE OF PROCEDURE:  05/09/2005  DATE OF DISCHARGE:                                   PROGRESS NOTE   DELIVERY NOTE   Ashleyann progressed rapidly after rupture of membranes and complained of an  urge to push at approximately 10:15 a.m.  She had a spontaneous vaginal  delivery at 10:25 a.m.  A loose nuchal cord was reduced.  The mouth and nose  were suctioned on the perineum.  The shoulders delivered without any  difficulty at all.  Weight is 7 pounds 1 ounce.  Apgar's are 9 and 9.  Pitocin 20 units dilated in 1000 mL of lactated Ringer's was infused rapidly  IV.  The placenta separated spontaneously and delivered via controlled cord  contraction at 10:30 a.m.  It was intact and appears to have a three-vessel  cord.  The vagina was then inspected and no lacerations were found.  Blood  loss was minimal.  Right prior to delivery, the patient had been  catheterized and relieved of about 300 mL of urine.      Jacklyn Shell, C.N.M.      Tilda Burrow, M.D.  Electronically Signed    FC/MEDQ  D:  05/09/2005  T:  05/10/2005  Job:  [id number]   cc:   Donna Bernard, M.D.  Fax: [phone]   [facility]

## 2010-08-31 ENCOUNTER — Emergency Department (HOSPITAL_COMMUNITY)
Admission: EM | Admit: 2010-08-31 | Discharge: 2010-08-31 | Disposition: A | Discharge status: Home / Self Care | Payer: No Typology Code available for payment source | Attending: Emergency Medicine | Admitting: Emergency Medicine

## 2010-08-31 DIAGNOSIS — M545 Low back pain, unspecified: Secondary | ICD-10-CM | POA: Insufficient documentation

## 2010-08-31 DIAGNOSIS — M546 Pain in thoracic spine: Secondary | ICD-10-CM | POA: Insufficient documentation

## 2011-04-01 ENCOUNTER — Emergency Department (HOSPITAL_COMMUNITY)
Admission: EM | Admit: 2011-04-01 | Discharge: 2011-04-01 | Disposition: A | Discharge status: Home / Self Care | Payer: Medicaid Other | Attending: Emergency Medicine | Admitting: Emergency Medicine

## 2011-04-01 ENCOUNTER — Encounter (HOSPITAL_COMMUNITY): Payer: Self-pay | Admitting: Emergency Medicine

## 2011-04-01 ENCOUNTER — Encounter (HOSPITAL_COMMUNITY): Payer: Self-pay

## 2011-04-01 DIAGNOSIS — IMO0001 Reserved for inherently not codable concepts without codable children: Secondary | ICD-10-CM | POA: Insufficient documentation

## 2011-04-01 DIAGNOSIS — R070 Pain in throat: Secondary | ICD-10-CM | POA: Insufficient documentation

## 2011-04-01 DIAGNOSIS — R509 Fever, unspecified: Secondary | ICD-10-CM | POA: Insufficient documentation

## 2011-04-01 DIAGNOSIS — E860 Dehydration: Secondary | ICD-10-CM | POA: Insufficient documentation

## 2011-04-01 DIAGNOSIS — J111 Influenza due to unidentified influenza virus with other respiratory manifestations: Secondary | ICD-10-CM | POA: Insufficient documentation

## 2011-04-01 DIAGNOSIS — B349 Viral infection, unspecified: Secondary | ICD-10-CM

## 2011-04-01 DIAGNOSIS — R51 Headache: Secondary | ICD-10-CM | POA: Insufficient documentation

## 2011-04-01 DIAGNOSIS — R112 Nausea with vomiting, unspecified: Secondary | ICD-10-CM | POA: Insufficient documentation

## 2011-04-01 LAB — DIFFERENTIAL
Basophils Absolute: 0 10*3/uL (ref 0.0–0.1)
Lymphocytes Relative: 12 % (ref 12–46)
Monocytes Absolute: 0.5 10*3/uL (ref 0.1–1.0)
Monocytes Relative: 10 % (ref 3–12)
Neutro Abs: 3.9 10*3/uL (ref 1.7–7.7)

## 2011-04-01 LAB — BASIC METABOLIC PANEL
CO2: 26 mEq/L (ref 19–32)
Chloride: 104 mEq/L (ref 96–112)
Creatinine, Ser: 0.84 mg/dL (ref 0.50–1.10)

## 2011-04-01 LAB — URINALYSIS, ROUTINE W REFLEX MICROSCOPIC
Glucose, UA: NEGATIVE mg/dL
Hgb urine dipstick: NEGATIVE
Specific Gravity, Urine: 1.025 (ref 1.005–1.030)
pH: 6.5 (ref 5.0–8.0)

## 2011-04-01 LAB — CBC
HCT: 34.7 % — ABNORMAL LOW (ref 36.0–46.0)
Hemoglobin: 11.9 g/dL — ABNORMAL LOW (ref 12.0–15.0)
RDW: 12.2 % (ref 11.5–15.5)
WBC: 5 10*3/uL (ref 4.0–10.5)

## 2011-04-01 LAB — POCT PREGNANCY, URINE: Preg Test, Ur: NEGATIVE

## 2011-04-01 MED ORDER — SODIUM CHLORIDE 0.9 % IV BOLUS (SEPSIS)
1000.0000 mL | Freq: Once | INTRAVENOUS | Status: AC
  Administered 2011-04-01: 1000 mL via INTRAVENOUS

## 2011-04-01 MED ORDER — MORPHINE SULFATE 2 MG/ML IJ SOLN
2.0000 mg | Freq: Once | INTRAMUSCULAR | Status: AC
  Administered 2011-04-01: 2 mg via INTRAVENOUS
  Filled 2011-04-01: qty 1

## 2011-04-01 MED ORDER — OSELTAMIVIR PHOSPHATE 75 MG PO CAPS: 75.0000 mg | ORAL_CAPSULE | Freq: Two times a day (BID) | ORAL | Status: AC

## 2011-04-01 MED ORDER — ONDANSETRON HCL 8 MG PO TABS: 8.0000 mg | ORAL_TABLET | ORAL | Status: AC | PRN

## 2011-04-01 MED ORDER — ONDANSETRON HCL 4 MG/2ML IJ SOLN
4.0000 mg | Freq: Once | INTRAMUSCULAR | Status: AC
  Administered 2011-04-01: 4 mg via INTRAVENOUS
  Filled 2011-04-01: qty 2

## 2011-04-01 MED ORDER — KETOROLAC TROMETHAMINE 30 MG/ML IJ SOLN
30.0000 mg | Freq: Once | INTRAMUSCULAR | Status: AC
  Administered 2011-04-01: 30 mg via INTRAVENOUS
  Filled 2011-04-01: qty 1

## 2011-04-01 MED ORDER — PANTOPRAZOLE SODIUM 40 MG IV SOLR
40.0000 mg | Freq: Once | INTRAVENOUS | Status: AC
  Administered 2011-04-01: 40 mg via INTRAVENOUS
  Filled 2011-04-01: qty 40

## 2011-04-01 NOTE — ED Notes (Signed)
Pt reports diagnosed with the flu yesterday and was given tamiflu.  Pt says has started vomiting after taking the tamiflu.  C/O feeling dizzy and lightheaded.

## 2011-04-01 NOTE — ED Notes (Signed)
Patient complaining of cough, fever, vomiting, and body aches.

## 2011-04-01 NOTE — ED Provider Notes (Signed)
History     CSN: [id number]  Arrival date & time 04/01/11  0016   First MD Initiated Contact with Patient 04/01/11 0040      Chief Complaint  Patient presents with  . Emesis  . Fever  . Generalized Body Aches    (Consider location/radiation/quality/duration/timing/severity/associated sxs/prior treatment) HPI Kathy Robertson is a 26 y.o. female who presents to the Emergency Department complaining of headache, sore throat, fever, chills, body aches, nausea, and vomiting that began this morning. She has taken tylenol with no relief.  PCP Dr. Sudie Bailey .History reviewed. No pertinent past medical history.  History reviewed. No pertinent past surgical history.  History reviewed. No pertinent family history.  History  Substance Use Topics  . Smoking status: Never Smoker   . Smokeless tobacco: Not on file  . Alcohol Use: No    OB History    Grav Para Term Preterm Abortions TAB SAB Ect Mult Living                  Review of Systems 10 Systems reviewed and are negative for acute change except as noted in the HPI. Allergies  Review of patient's allergies indicates no known allergies.  Home Medications  No current outpatient prescriptions on file.  BP 103/64  Pulse 99  Temp(Src) 99.7 F (37.6 C) (Oral)  Resp 16  Ht 5\' 7"  (1.702 m)  Wt 165 lb (74.844 kg)  BMI 25.84 kg/m2  SpO2 97%  LMP 03/29/2011  Physical Exam  Nursing note and vitals reviewed. Constitutional: She is oriented to person, place, and time. She appears well-developed and well-nourished. No distress.  HENT:  Head: Normocephalic.  Right Ear: External ear normal.  Left Ear: External ear normal.  Nose: Nose normal.  Mouth/Throat: Oropharynx is clear and moist.  Eyes: Conjunctivae and EOM are normal. Pupils are equal, round, and reactive to light.  Neck: Normal range of motion. Neck supple.  Cardiovascular: Normal rate, normal heart sounds and intact distal pulses.   Pulmonary/Chest: Effort normal  and breath sounds normal.  Abdominal: Soft. Bowel sounds are normal.  Musculoskeletal: Normal range of motion.  Neurological: She is alert and oriented to person, place, and time. She has normal reflexes.  Skin: Skin is warm and dry.    ED Course  Procedures (including critical care time)  Results for orders placed during the hospital encounter of 04/01/11  URINALYSIS, ROUTINE W REFLEX MICROSCOPIC      Component Value Range   Color, Urine YELLOW  YELLOW    APPearance CLEAR  CLEAR    Specific Gravity, Urine 1.025  1.005 - 1.030    pH 6.5  5.0 - 8.0    Glucose, UA NEGATIVE  NEGATIVE (mg/dL)   Hgb urine dipstick NEGATIVE  NEGATIVE    Bilirubin Urine SMALL (*) NEGATIVE    Ketones, ur >80 (*) NEGATIVE (mg/dL)   Protein, ur NEGATIVE  NEGATIVE (mg/dL)   Urobilinogen, UA 1.0  0.0 - 1.0 (mg/dL)   Nitrite NEGATIVE  NEGATIVE    Leukocytes, UA NEGATIVE  NEGATIVE   POCT PREGNANCY, URINE      Component Value Range   Preg Test, Ur NEGATIVE  NEGATIVE      MDM  Patient with flu like symptoms that began this morning. Given IVF, analgesics, antiemetic, antiinflammatory with improvement. Pt feels improved after observation and/or treatment in ED.Pt stable in ED with no significant deterioration in condition.The patient appears reasonably screened and/or stabilized for discharge and I doubt any other medical condition  or other [facility] requiring further screening, evaluation, or treatment in the ED at this time prior to discharge.  MDM Reviewed: nursing note and vitals Interpretation: labs           Nicoletta Dress. Colon Branch, MD 04/01/11 7846

## 2011-04-01 NOTE — ED Notes (Signed)
Pt D/C to home with friend.

## 2011-04-01 NOTE — ED Provider Notes (Signed)
History   This chart was scribed for Donnetta Hutching, MD by Sofie Rower. The patient was seen in room [facility] and the patient's care was started at 6:16PM.    CSN: [id number]  Arrival date & time 04/01/11  1641   First MD Initiated Contact with Patient 04/01/11 1747      Chief Complaint  Patient presents with  . Influenza    (Consider location/radiation/quality/duration/timing/severity/associated sxs/prior treatment) HPI  Kathy Robertson is a 26 y.o. female who presents to the Emergency Department complaining of moderate, constant influenza onset yesterday with associated symptoms of vomiting, dizziness, chills, shortness of breath, myalgias. Pt was diagnosed with the flu yesterday at [facility] and given tamiflu. Last night the pt was given 1 bag of IV fluids. Pt states that she began vomiting after taking the tamiflu. Pt states that it is hard to breathe at present and did not have a fever last night but started one today. Pt cannot keep fluids down, states that she did urinate today.   PCP is Dr. Sudie Bailey.   History reviewed. No pertinent past medical history.  History reviewed. No pertinent past surgical history.  No family history on file.  History  Substance Use Topics  . Smoking status: Never Smoker   . Smokeless tobacco: Not on file  . Alcohol Use: No    OB History    Grav Para Term Preterm Abortions TAB SAB Ect Mult Living                  Review of Systems  10 Systems reviewed and are negative for acute change except as noted in the HPI.   Allergies  Tamiflu  Home Medications   Current Outpatient Rx  Name Route Sig Dispense Refill  . IBUPROFEN 800 MG PO TABS Oral Take 800 mg by mouth every 8 (eight) hours as needed. For pain    . OSELTAMIVIR PHOSPHATE 75 MG PO CAPS Oral Take 1 capsule (75 mg total) by mouth every 12 (twelve) hours. 10 capsule 0    BP 104/55  Pulse 93  Temp(Src) 101.3 F (38.5 C) (Oral)  Resp 22  Ht 5\' 7"  (1.702 m)  Wt 165 lb (74.844  kg)  BMI 25.84 kg/m2  SpO2 100%  LMP 03/29/2011  Physical Exam  Nursing note and vitals reviewed. Constitutional: She is oriented to person, place, and time. She appears well-developed and well-nourished.       Dehydrated.   HENT:  Head: Normocephalic and atraumatic.  Eyes: Conjunctivae and EOM are normal.  Neck: Normal range of motion. Neck supple.  Cardiovascular: Normal rate and regular rhythm.   Pulmonary/Chest: Effort normal and breath sounds normal.  Abdominal: Soft. Bowel sounds are normal.  Musculoskeletal: Normal range of motion.  Neurological: She is alert and oriented to person, place, and time.  Skin: Skin is warm and dry.  Psychiatric: She has a normal mood and affect. Her behavior is normal.    ED Course  Procedures (including critical care time)  DIAGNOSTIC STUDIES: Oxygen Saturation is 100% on room air, normal by my interpretation.    COORDINATION OF CARE:  Results for orders placed during the hospital encounter of 04/01/11  CBC      Component Value Range   WBC 5.0  4.0 - 10.5 (K/uL)   RBC 3.81 (*) 3.87 - 5.11 (MIL/uL)   Hemoglobin 11.9 (*) 12.0 - 15.0 (g/dL)   HCT 40.9 (*) 81.1 - 46.0 (%)   MCV 91.1  78.0 - 100.0 (fL)  MCH 31.2  26.0 - 34.0 (pg)   MCHC 34.3  30.0 - 36.0 (g/dL)   RDW 40.9  81.1 - 91.4 (%)   Platelets 162  150 - 400 (K/uL)  DIFFERENTIAL      Component Value Range   Neutrophils Relative 78 (*) 43 - 77 (%)   Neutro Abs 3.9  1.7 - 7.7 (K/uL)   Lymphocytes Relative 12  12 - 46 (%)   Lymphs Abs 0.6 (*) 0.7 - 4.0 (K/uL)   Monocytes Relative 10  3 - 12 (%)   Monocytes Absolute 0.5  0.1 - 1.0 (K/uL)   Eosinophils Relative 0  0 - 5 (%)   Eosinophils Absolute 0.0  0.0 - 0.7 (K/uL)   Basophils Relative 0  0 - 1 (%)   Basophils Absolute 0.0  0.0 - 0.1 (K/uL)  BASIC METABOLIC PANEL      Component Value Range   Sodium 139  135 - 145 (mEq/L)   Potassium 3.4 (*) 3.5 - 5.1 (mEq/L)   Chloride 104  96 - 112 (mEq/L)   CO2 26  19 - 32 (mEq/L)    Glucose, Bld 98  70 - 99 (mg/dL)   BUN 5 (*) 6 - 23 (mg/dL)   Creatinine, Ser 7.82  0.50 - 1.10 (mg/dL)   Calcium 8.7  8.4 - 95.6 (mg/dL)   GFR calc non Af Amer >90  >90 (mL/min)   GFR calc Af Amer >90  >90 (mL/min)   No results found.    6:17PM- EDP at bedside discusses treatment plan concerning IV fluids, blood work, and urine sample. EDP to give pt zofran and protonix   MDM  History and physical consistent with viral illness. Passing urine. Can discharge home.     I personally performed the services described in this documentation, which was scribed in my presence. The recorded information has been reviewed and considered.      Donnetta Hutching, MD 04/01/11 2147

## 2011-04-01 NOTE — ED Notes (Signed)
Pt resting in bed.  Pt states she still feels rough.  Dr Adriana Simas notified

## 2011-06-22 ENCOUNTER — Encounter (HOSPITAL_COMMUNITY): Payer: Self-pay | Admitting: *Deleted

## 2011-06-22 ENCOUNTER — Emergency Department (HOSPITAL_COMMUNITY)
Admission: EM | Admit: 2011-06-22 | Discharge: 2011-06-22 | Disposition: A | Discharge status: Home / Self Care | Payer: Medicaid Other | Attending: Emergency Medicine | Admitting: Emergency Medicine

## 2011-06-22 ENCOUNTER — Emergency Department (HOSPITAL_COMMUNITY): Payer: Medicaid Other

## 2011-06-22 DIAGNOSIS — M25559 Pain in unspecified hip: Secondary | ICD-10-CM | POA: Insufficient documentation

## 2011-06-22 DIAGNOSIS — M542 Cervicalgia: Secondary | ICD-10-CM | POA: Insufficient documentation

## 2011-06-22 DIAGNOSIS — R079 Chest pain, unspecified: Secondary | ICD-10-CM | POA: Insufficient documentation

## 2011-06-22 DIAGNOSIS — M546 Pain in thoracic spine: Secondary | ICD-10-CM | POA: Insufficient documentation

## 2011-06-22 DIAGNOSIS — M545 Low back pain, unspecified: Secondary | ICD-10-CM | POA: Insufficient documentation

## 2011-06-22 DIAGNOSIS — R51 Headache: Secondary | ICD-10-CM | POA: Insufficient documentation

## 2011-06-22 DIAGNOSIS — R52 Pain, unspecified: Secondary | ICD-10-CM

## 2011-06-22 DIAGNOSIS — R109 Unspecified abdominal pain: Secondary | ICD-10-CM | POA: Insufficient documentation

## 2011-06-22 MED ORDER — SODIUM CHLORIDE 0.9 % IV SOLN
Freq: Once | INTRAVENOUS | Status: AC
  Administered 2011-06-22: 12:00:00 via INTRAVENOUS

## 2011-06-22 MED ORDER — OXYCODONE-ACETAMINOPHEN 5-325 MG PO TABS: ORAL_TABLET | ORAL | Status: DC

## 2011-06-22 MED ORDER — HYDROMORPHONE HCL PF 1 MG/ML IJ SOLN
1.0000 mg | Freq: Once | INTRAMUSCULAR | Status: AC
  Administered 2011-06-22: 1 mg via INTRAVENOUS
  Filled 2011-06-22: qty 1

## 2011-06-22 MED ORDER — ONDANSETRON HCL 4 MG/2ML IJ SOLN
4.0000 mg | Freq: Once | INTRAMUSCULAR | Status: AC
  Administered 2011-06-22: 4 mg via INTRAVENOUS
  Filled 2011-06-22: qty 2

## 2011-06-22 MED ORDER — ONDANSETRON HCL 4 MG PO TABS: 4.0000 mg | ORAL_TABLET | Freq: Four times a day (QID) | ORAL | Status: AC
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
## 2011-06-22 NOTE — ED Notes (Signed)
Patient given ginger ale - approved by nurse. 

## 2011-06-22 NOTE — ED Notes (Signed)
Rick Miller, PA at bedside. 

## 2011-06-22 NOTE — ED Notes (Signed)
Patient with no complaints at this time. Respirations even and unlabored. Skin warm/dry. Discharge instructions reviewed with patient at this time. Patient given opportunity to voice concerns/ask questions. IV removed per policy and band-aid applied to site. Patient discharged at this time and left Emergency Department with steady gait.  
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
## 2011-06-24 NOTE — ED Provider Notes (Signed)
Medical screening examination/treatment/procedure(s) were performed by non-physician practitioner and as supervising physician I was immediately available for consultation/collaboration.  Geoffery Lyons, MD 06/24/11 [phone]

## 2011-10-21 ENCOUNTER — Encounter (HOSPITAL_COMMUNITY): Payer: Self-pay | Admitting: *Deleted

## 2011-10-21 HISTORY — DX: Unspecified asthma, uncomplicated: J45.909

## 2012-01-31 ENCOUNTER — Emergency Department (HOSPITAL_COMMUNITY): Payer: Medicaid Other

## 2012-01-31 ENCOUNTER — Encounter (HOSPITAL_COMMUNITY): Payer: Self-pay | Admitting: *Deleted

## 2012-01-31 ENCOUNTER — Emergency Department (HOSPITAL_COMMUNITY)
Admission: EM | Admit: 2012-01-31 | Discharge: 2012-02-01 | Disposition: A | Discharge status: Home / Self Care | Payer: Medicaid Other | Attending: Emergency Medicine | Admitting: Emergency Medicine

## 2012-01-31 DIAGNOSIS — R5383 Other fatigue: Secondary | ICD-10-CM | POA: Insufficient documentation

## 2012-01-31 DIAGNOSIS — F17200 Nicotine dependence, unspecified, uncomplicated: Secondary | ICD-10-CM | POA: Insufficient documentation

## 2012-01-31 DIAGNOSIS — Z79899 Other long term (current) drug therapy: Secondary | ICD-10-CM | POA: Insufficient documentation

## 2012-01-31 DIAGNOSIS — J029 Acute pharyngitis, unspecified: Secondary | ICD-10-CM

## 2012-01-31 DIAGNOSIS — J45909 Unspecified asthma, uncomplicated: Secondary | ICD-10-CM | POA: Insufficient documentation

## 2012-01-31 DIAGNOSIS — R6883 Chills (without fever): Secondary | ICD-10-CM | POA: Insufficient documentation

## 2012-01-31 DIAGNOSIS — R52 Pain, unspecified: Secondary | ICD-10-CM | POA: Insufficient documentation

## 2012-01-31 DIAGNOSIS — R109 Unspecified abdominal pain: Secondary | ICD-10-CM | POA: Insufficient documentation

## 2012-01-31 DIAGNOSIS — J36 Peritonsillar abscess: Secondary | ICD-10-CM | POA: Insufficient documentation

## 2012-01-31 DIAGNOSIS — J039 Acute tonsillitis, unspecified: Secondary | ICD-10-CM

## 2012-01-31 DIAGNOSIS — R112 Nausea with vomiting, unspecified: Secondary | ICD-10-CM

## 2012-01-31 DIAGNOSIS — R5381 Other malaise: Secondary | ICD-10-CM | POA: Insufficient documentation

## 2012-01-31 LAB — COMPREHENSIVE METABOLIC PANEL
Albumin: 3.7 g/dL (ref 3.5–5.2)
BUN: 7 mg/dL (ref 6–23)
Calcium: 9.5 mg/dL (ref 8.4–10.5)
Creatinine, Ser: 0.86 mg/dL (ref 0.50–1.10)
Total Bilirubin: 0.4 mg/dL (ref 0.3–1.2)
Total Protein: 7.6 g/dL (ref 6.0–8.3)

## 2012-01-31 LAB — URINALYSIS, ROUTINE W REFLEX MICROSCOPIC
Bilirubin Urine: NEGATIVE
Leukocytes, UA: NEGATIVE
Nitrite: NEGATIVE
Specific Gravity, Urine: >1.03 — ABNORMAL HIGH (ref 1.005–1.030)
pH: 6 (ref 5.0–8.0)

## 2012-01-31 LAB — CBC WITH DIFFERENTIAL/PLATELET
Eosinophils Relative: 0 % (ref 0–5)
HCT: 37.9 % (ref 36.0–46.0)
Hemoglobin: 13.1 g/dL (ref 12.0–15.0)
Lymphocytes Relative: 3 % — ABNORMAL LOW (ref 12–46)
Lymphs Abs: 0.5 10*3/uL — ABNORMAL LOW (ref 0.7–4.0)
MCV: 91.3 fL (ref 78.0–100.0)
Monocytes Relative: 6 % (ref 3–12)
Platelets: 160 10*3/uL (ref 150–400)
RBC: 4.15 MIL/uL (ref 3.87–5.11)
WBC: 15.2 10*3/uL — ABNORMAL HIGH (ref 4.0–10.5)

## 2012-01-31 LAB — PREGNANCY, URINE: Preg Test, Ur: NEGATIVE

## 2012-01-31 LAB — LIPASE, BLOOD: Lipase: 24 U/L (ref 11–59)

## 2012-01-31 LAB — URINE MICROSCOPIC-ADD ON

## 2012-01-31 MED ORDER — IOHEXOL 300 MG/ML  SOLN
75.0000 mL | Freq: Once | INTRAMUSCULAR | Status: AC | PRN
  Administered 2012-01-31: 75 mL via INTRAVENOUS

## 2012-01-31 MED ORDER — SODIUM CHLORIDE 0.9 % IV BOLUS (SEPSIS)
1000.0000 mL | Freq: Once | INTRAVENOUS | Status: AC
  Administered 2012-02-01: 1000 mL via INTRAVENOUS

## 2012-01-31 MED ORDER — SODIUM CHLORIDE 0.9 % IV SOLN
Freq: Once | INTRAVENOUS | Status: AC
  Administered 2012-01-31: 21:00:00 via INTRAVENOUS

## 2012-01-31 MED ORDER — SODIUM CHLORIDE 0.9 % IV SOLN: INTRAVENOUS | Status: DC

## 2012-01-31 MED ORDER — FAMOTIDINE IN NACL 20-0.9 MG/50ML-% IV SOLN
20.0000 mg | Freq: Once | INTRAVENOUS | Status: AC
  Administered 2012-01-31: 20 mg via INTRAVENOUS
  Filled 2012-01-31: qty 50

## 2012-01-31 MED ORDER — PANTOPRAZOLE SODIUM 40 MG IV SOLR
40.0000 mg | Freq: Once | INTRAVENOUS | Status: AC
  Administered 2012-01-31: 40 mg via INTRAVENOUS
  Filled 2012-01-31: qty 40

## 2012-01-31 MED ORDER — SODIUM CHLORIDE 0.9 % IV BOLUS (SEPSIS)
1000.0000 mL | Freq: Once | INTRAVENOUS | Status: AC
  Administered 2012-01-31: 1000 mL via INTRAVENOUS

## 2012-01-31 MED ORDER — PROMETHAZINE HCL 25 MG/ML IJ SOLN
12.5000 mg | Freq: Once | INTRAMUSCULAR | Status: AC
  Administered 2012-01-31: 12.5 mg via INTRAVENOUS
  Filled 2012-01-31: qty 1

## 2012-01-31 MED ORDER — SODIUM CHLORIDE 0.9 % IV BOLUS (SEPSIS): 500.0000 mL | Freq: Once | INTRAVENOUS | Status: DC

## 2012-01-31 MED ORDER — ONDANSETRON HCL 4 MG/2ML IJ SOLN
4.0000 mg | INTRAMUSCULAR | Status: DC | PRN
  Administered 2012-01-31: 4 mg via INTRAVENOUS
  Filled 2012-01-31: qty 2

## 2012-01-31 NOTE — ED Notes (Signed)
Pt states with N/V/D that started today, unable to keep anything down per pt.

## 2012-01-31 NOTE — ED Notes (Signed)
Pt states vomited x 5 today, fever, chills, sore throat, body aches, and abdominal pain. Symptoms began yesterday

## 2012-01-31 NOTE — ED Provider Notes (Signed)
History     CSN: [id number]  Arrival date & time 01/31/12  1731   First MD Initiated Contact with Patient 01/31/12 2021      Chief Complaint  Patient presents with  . Emesis  . Generalized Body Aches     HPI Pt was seen at 2030.  Per pt, c/o gradual onset and persistence of multiple intermittent episodes of N/V/D for the past 2 days.  Has been associated with upper abd "pain," chills, generalized body aches and fatigue.  Denies fevers, no rash, no CP/SOB, no back pain, no black or blood in stools or emesis.    Past Medical History  Diagnosis Date  . Asthma     History reviewed. No pertinent past surgical history.   History  Substance Use Topics  . Smoking status: Current Every Day Smoker  . Smokeless tobacco: Not on file  . Alcohol Use: No      Review of Systems ROS: Statement: All systems negative except as marked or noted in the HPI; Constitutional: Negative for fever and +generalized body aches, fatigue, and chills. ; ; Eyes: Negative for eye pain, redness and discharge. ; ; ENMT: Negative for ear pain, hoarseness, nasal congestion, sinus pressure and sore throat. ; ; Cardiovascular: Negative for chest pain, palpitations, diaphoresis, dyspnea and peripheral edema. ; ; Respiratory: Negative for cough, wheezing and stridor. ; ; Gastrointestinal: +N/V/D, abd pain. Negative for blood in stool, hematemesis, jaundice and rectal bleeding. . ; ; Genitourinary: Negative for dysuria, flank pain and hematuria. ; ; Musculoskeletal: Negative for back pain and neck pain. Negative for swelling and trauma.; ; Skin: Negative for pruritus, rash, abrasions, blisters, bruising and skin lesion.; ; Neuro: Negative for headache, lightheadedness and neck stiffness. Negative for weakness, altered level of consciousness , altered mental status, extremity weakness, paresthesias, involuntary movement, seizure and syncope.       Allergies  Tamiflu  Home Medications   Current Outpatient Rx    Name  Route  Sig  Dispense  Refill  . ALBUTEROL SULFATE HFA 108 (90 BASE) MCG/ACT IN AERS   Inhalation   Inhale 2 puffs into the lungs every 6 (six) hours as needed. Shortness of Breath         . PHENYLEPH-DOXYLAMINE-DM-APAP 5-6.25-10-325 MG/15ML PO LIQD   Oral   Take 15 mLs by mouth daily as needed. For cold and flu symptoms         . PHENYLEPHRINE-DM-GG-APAP 5-10-200-325 MG/15ML PO LIQD   Oral   Take 15 mLs by mouth daily as needed. Fir cold and flu symptoms           BP 137/70  Pulse 108  Temp 99.1 F (37.3 C) (Oral)  Resp 20  Ht 5\' 7"  (1.702 m)  Wt 165 lb (74.844 kg)  BMI 25.84 kg/m2  SpO2 100%  LMP 01/17/2012  Physical Exam 2035: Physical examination:  Nursing notes reviewed; Vital signs and O2 SAT reviewed;  Constitutional: Well developed, Well nourished, In no acute distress; Head:  Normocephalic, atraumatic; Eyes: EOMI, PERRL, No scleral icterus; ENMT: Mucous membranes dry; Neck: Supple, Full range of motion, No lymphadenopathy; Cardiovascular: Regular rate and rhythm, No murmur, rub, or gallop; Respiratory: Breath sounds clear & equal bilaterally, No rales, rhonchi, wheezes.  Speaking full sentences with ease, Normal respiratory effort/excursion; Chest: Nontender, Movement normal; Abdomen: Soft, +mid-epigastric tenderness to palp. No rebound or guarding. Nondistended, Normal bowel sounds; Genitourinary: No CVA tenderness; Extremities: Pulses normal, No tenderness, No edema, No calf edema or asymmetry.; Neuro:  AA&Ox3, Major CN grossly intact.  Speech clear. No gross focal motor or sensory deficits in extremities.; Skin: Color normal, Warm, Dry, no rash.   ED Course  Procedures     MDM  MDM Reviewed: previous chart, nursing note and vitals Interpretation: labs and x-ray   Results for orders placed during the hospital encounter of 01/31/12  PREGNANCY, URINE      Component Value Range   Preg Test, Ur NEGATIVE  NEGATIVE  URINALYSIS, ROUTINE W REFLEX  MICROSCOPIC      Component Value Range   Color, Urine YELLOW  YELLOW   APPearance CLEAR  CLEAR   Specific Gravity, Urine >1.030 (*) 1.005 - 1.030   pH 6.0  5.0 - 8.0   Glucose, UA NEGATIVE  NEGATIVE mg/dL   Hgb urine dipstick TRACE (*) NEGATIVE   Bilirubin Urine NEGATIVE  NEGATIVE   Ketones, ur 15 (*) NEGATIVE mg/dL   Protein, ur TRACE (*) NEGATIVE mg/dL   Urobilinogen, UA 0.2  0.0 - 1.0 mg/dL   Nitrite NEGATIVE  NEGATIVE   Leukocytes, UA NEGATIVE  NEGATIVE  COMPREHENSIVE METABOLIC PANEL      Component Value Range   Sodium 133 (*) 135 - 145 mEq/L   Potassium 3.6  3.5 - 5.1 mEq/L   Chloride 98  96 - 112 mEq/L   CO2 26  19 - 32 mEq/L   Glucose, Bld 124 (*) 70 - 99 mg/dL   BUN 7  6 - 23 mg/dL   Creatinine, Ser 1.47  0.50 - 1.10 mg/dL   Calcium 9.5  8.4 - 82.9 mg/dL   Total Protein 7.6  6.0 - 8.3 g/dL   Albumin 3.7  3.5 - 5.2 g/dL   AST 13  0 - 37 U/L   ALT 10  0 - 35 U/L   Alkaline Phosphatase 43  39 - 117 U/L   Total Bilirubin 0.4  0.3 - 1.2 mg/dL   GFR calc non Af Amer >90  >90 mL/min   GFR calc Af Amer >90  >90 mL/min  CBC WITH DIFFERENTIAL      Component Value Range   WBC 15.2 (*) 4.0 - 10.5 K/uL   RBC 4.15  3.87 - 5.11 MIL/uL   Hemoglobin 13.1  12.0 - 15.0 g/dL   HCT 56.2  13.0 - 86.5 %   MCV 91.3  78.0 - 100.0 fL   MCH 31.6  26.0 - 34.0 pg   MCHC 34.6  30.0 - 36.0 g/dL   RDW 78.4  69.6 - 29.5 %   Platelets 160  150 - 400 K/uL   Neutrophils Relative 91 (*) 43 - 77 %   Neutro Abs 13.8 (*) 1.7 - 7.7 K/uL   Lymphocytes Relative 3 (*) 12 - 46 %   Lymphs Abs 0.5 (*) 0.7 - 4.0 K/uL   Monocytes Relative 6  3 - 12 %   Monocytes Absolute 0.9  0.1 - 1.0 K/uL   Eosinophils Relative 0  0 - 5 %   Eosinophils Absolute 0.0  0.0 - 0.7 K/uL   Basophils Relative 0  0 - 1 %   Basophils Absolute 0.0  0.0 - 0.1 K/uL  LIPASE, BLOOD      Component Value Range   Lipase 24  11 - 59 U/L  URINE MICROSCOPIC-ADD ON      Component Value Range   Squamous Epithelial / LPF MANY (*) RARE    WBC, UA 0-2  <3 WBC/hpf   RBC / HPF 0-2  <  3 RBC/hpf   Bacteria, UA FEW (*) RARE   Dg Abd Acute W/chest 01/31/2012  *RADIOLOGY REPORT*  Clinical Data: Abdominal pain, nausea, vomiting and diarrhea.  ACUTE ABDOMEN SERIES (ABDOMEN 2 VIEW & CHEST 1 VIEW)  Comparison: Chest radiograph performed 03/06/2005, and CT of the chest, abdomen and pelvis performed 06/22/2011  Findings: The lungs are well-aerated and clear.  There is no evidence of focal opacification, pleural effusion or pneumothorax. The cardiomediastinal silhouette is within normal limits.  The visualized bowel gas pattern is unremarkable.  Scattered stool and air are seen within the colon; there is no evidence of small bowel dilatation to suggest obstruction.  No free intra-abdominal air is identified on the provided upright view.  No acute osseous abnormalities are seen; the sacroiliac joints are unremarkable in appearance.  IMPRESSION:  1.  Unremarkable bowel gas pattern; no free intra-abdominal air seen. 2.  No acute cardiopulmonary process identified.   Original Report Authenticated By: Tonia Ghent, M.D.     2215:   Pt refuses to take PO, and now informs ED RN and I that she has had a sore throat since yesterday, worsening today.  +posterior pharynx erythematous with tonsillar exudates bilat.  +left sided soft palate and tonsillar bulging.  No intra-oral edema, no hoarse voice, no drooling, no stridor. Will obtain CT neck r/o peritonsillar abscess.  Pt also continues to c/o "feeling so tired."  Will check rapid strep and mono test.  Sign out to Dr. Effie Shy.          Laray Anger, DO 01/31/12 2354

## 2012-01-31 NOTE — ED Notes (Signed)
Patient transported to X-ray 

## 2012-02-01 MED ORDER — OXYCODONE-ACETAMINOPHEN 5-325 MG PO TABS: 1.0000 | ORAL_TABLET | ORAL | Status: DC | PRN

## 2012-02-01 MED ORDER — HYDROMORPHONE HCL PF 1 MG/ML IJ SOLN
0.5000 mg | Freq: Once | INTRAMUSCULAR | Status: AC
  Administered 2012-02-01: 0.5 mg via INTRAVENOUS

## 2012-02-01 MED ORDER — DEXTROSE 5 % IV SOLN
1.0000 g | Freq: Once | INTRAVENOUS | Status: DC
  Filled 2012-02-01: qty 10

## 2012-02-01 MED ORDER — HYDROMORPHONE HCL PF 1 MG/ML IJ SOLN
0.5000 mg | Freq: Once | INTRAMUSCULAR | Status: DC
  Filled 2012-02-01: qty 1

## 2012-02-01 MED ORDER — DEXTROSE 5 % IV SOLN
1.0000 g | Freq: Once | INTRAVENOUS | Status: AC
  Administered 2012-02-01: 1 g via INTRAVENOUS

## 2012-02-01 MED ORDER — AMOXICILLIN-POT CLAVULANATE 875-125 MG PO TABS: 1.0000 | ORAL_TABLET | Freq: Two times a day (BID) | ORAL | Status: DC
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
## 2012-02-01 NOTE — ED Provider Notes (Signed)
Kathy Robertson is a 26 y.o. female is being evaluated for abdominal pain, myalgias, and sore throat. Patient, states she's still uncomfortable. CT scan showed a left tonsil abscess with tonsillar enlargement. There is also noted narrowing of the oropharynx/upper hypopharynx.  Clinical exam: She is alert, calm, and resting, nearly supine. She is able to sit up, and cooperate with exam. There is no stridor. There is no trismus. There is moderate. Posterior pharynx erythema with tonsillar atrophy and exudate. Left, greater than right. She has mild, anterior, cervical adenopathy. Normal range of motion of the neck.  Case was discussed with the on-call ENT surgeon, Dr. Jearld Fenton. He recommends treatment with oral antibiotics, and expectant management. His office would be happy to see her in followup in the coming week.   Ct Soft Tissue Neck W Contrast  01/31/2012  *RADIOLOGY REPORT*  Clinical Data: Sore throat, fever, and throat swelling.  CT NECK WITH CONTRAST  Technique:  Multidetector CT imaging of the neck was performed with intravenous contrast.  Contrast:  75 mL of Omnipaque 300 IV contrast  Comparison: CT of the cervical spine performed 06/22/2011  Findings: There is an apparent early evolving abscess within the left palatine tonsil, best characterized on sagittal images, measuring approximately 3.1 x 1.6 x 1.8 cm.  Significant associated enlargement of the left palatine tonsil is noted, with narrowing of the oropharynx and upper hypopharynx.  The valleculae and piriform sinuses remain grossly intact.  The nasopharynx is grossly unremarkable in appearance; the adenoids appear relatively symmetric.  The parotid and submandibular glands are grossly unremarkable in appearance.  There is no evidence of vascular compromise.  No definite cervical lymphadenopathy is seen.  Prevertebral soft tissues are within normal limits.  The visualized portions of the brain are unremarkable in appearance.  The orbits are  within normal limits.  The visualized paranasal sinuses and mastoid air cells are well-aerated.  The thyroid gland is within normal limits.  The visualized superior mediastinum is unremarkable in appearance.  The visualized lung apices are clear.  No acute osseous abnormalities are identified.  IMPRESSION: Apparent early evolving peripherally enhancing abscess within the left palatine tonsil, measuring 3.1 x 1.6 x 1.8 cm, with significant enlargement of the left palatine tonsil, and narrowing of the oropharynx and upper hypopharynx.   Original Report Authenticated By: Tonia Ghent, M.D.    Dg Abd Acute W/chest  01/31/2012  *RADIOLOGY REPORT*  Clinical Data: Abdominal pain, nausea, vomiting and diarrhea.  ACUTE ABDOMEN SERIES (ABDOMEN 2 VIEW & CHEST 1 VIEW)  Comparison: Chest radiograph performed 03/06/2005, and CT of the chest, abdomen and pelvis performed 06/22/2011  Findings: The lungs are well-aerated and clear.  There is no evidence of focal opacification, pleural effusion or pneumothorax. The cardiomediastinal silhouette is within normal limits.  The visualized bowel gas pattern is unremarkable.  Scattered stool and air are seen within the colon; there is no evidence of small bowel dilatation to suggest obstruction.  No free intra-abdominal air is identified on the provided upright view.  No acute osseous abnormalities are seen; the sacroiliac joints are unremarkable in appearance.  IMPRESSION:  1.  Unremarkable bowel gas pattern; no free intra-abdominal air seen. 2.  No acute cardiopulmonary process identified.   Original Report Authenticated By: Tonia Ghent, M.D.     Results for orders placed during the hospital encounter of 01/31/12  PREGNANCY, URINE      Component Value Range   Preg Test, Ur NEGATIVE  NEGATIVE  URINALYSIS, ROUTINE W REFLEX MICROSCOPIC  Component Value Range   Color, Urine YELLOW  YELLOW   APPearance CLEAR  CLEAR   Specific Gravity, Urine >1.030 (*) 1.005 - 1.030   pH  6.0  5.0 - 8.0   Glucose, UA NEGATIVE  NEGATIVE mg/dL   Hgb urine dipstick TRACE (*) NEGATIVE   Bilirubin Urine NEGATIVE  NEGATIVE   Ketones, ur 15 (*) NEGATIVE mg/dL   Protein, ur TRACE (*) NEGATIVE mg/dL   Urobilinogen, UA 0.2  0.0 - 1.0 mg/dL   Nitrite NEGATIVE  NEGATIVE   Leukocytes, UA NEGATIVE  NEGATIVE  COMPREHENSIVE METABOLIC PANEL      Component Value Range   Sodium 133 (*) 135 - 145 mEq/L   Potassium 3.6  3.5 - 5.1 mEq/L   Chloride 98  96 - 112 mEq/L   CO2 26  19 - 32 mEq/L   Glucose, Bld 124 (*) 70 - 99 mg/dL   BUN 7  6 - 23 mg/dL   Creatinine, Ser 4.09  0.50 - 1.10 mg/dL   Calcium 9.5  8.4 - 81.1 mg/dL   Total Protein 7.6  6.0 - 8.3 g/dL   Albumin 3.7  3.5 - 5.2 g/dL   AST 13  0 - 37 U/L   ALT 10  0 - 35 U/L   Alkaline Phosphatase 43  39 - 117 U/L   Total Bilirubin 0.4  0.3 - 1.2 mg/dL   GFR calc non Af Amer >90  >90 mL/min   GFR calc Af Amer >90  >90 mL/min  CBC WITH DIFFERENTIAL      Component Value Range   WBC 15.2 (*) 4.0 - 10.5 K/uL   RBC 4.15  3.87 - 5.11 MIL/uL   Hemoglobin 13.1  12.0 - 15.0 g/dL   HCT 91.4  78.2 - 95.6 %   MCV 91.3  78.0 - 100.0 fL   MCH 31.6  26.0 - 34.0 pg   MCHC 34.6  30.0 - 36.0 g/dL   RDW 21.3  08.6 - 57.8 %   Platelets 160  150 - 400 K/uL   Neutrophils Relative 91 (*) 43 - 77 %   Neutro Abs 13.8 (*) 1.7 - 7.7 K/uL   Lymphocytes Relative 3 (*) 12 - 46 %   Lymphs Abs 0.5 (*) 0.7 - 4.0 K/uL   Monocytes Relative 6  3 - 12 %   Monocytes Absolute 0.9  0.1 - 1.0 K/uL   Eosinophils Relative 0  0 - 5 %   Eosinophils Absolute 0.0  0.0 - 0.7 K/uL   Basophils Relative 0  0 - 1 %   Basophils Absolute 0.0  0.0 - 0.1 K/uL  LIPASE, BLOOD      Component Value Range   Lipase 24  11 - 59 U/L  URINE MICROSCOPIC-ADD ON      Component Value Range   Squamous Epithelial / LPF MANY (*) RARE   WBC, UA 0-2  <3 WBC/hpf   RBC / HPF 0-2  <3 RBC/hpf   Bacteria, UA FEW (*) RARE  RAPID STREP SCREEN      Component Value Range   Streptococcus, Group  A Screen (Direct) NEGATIVE  NEGATIVE  MONONUCLEOSIS SCREEN      Component Value Range   Mono Screen NEGATIVE  NEGATIVE     Medical decision making: Tonsillar infection, with abscess. Airway stable.Doubt metabolic instability, serious bacterial infection or impending vascular collapse; the patient is stable for discharge.   Plan: Home Medications- Augmentin and Percocet; Home Treatments- Rest, gargle;  Recommended follow up- Return here tomorrow for check up, f/u ENT in 4-5 days.   Flint Melter, MD 02/01/12 [phone]

## 2012-05-08 ENCOUNTER — Emergency Department (HOSPITAL_COMMUNITY)
Admission: EM | Admit: 2012-05-08 | Discharge: 2012-05-08 | Disposition: A | Discharge status: Home / Self Care | Payer: Medicaid Other | Attending: Emergency Medicine | Admitting: Emergency Medicine

## 2012-05-08 ENCOUNTER — Encounter (HOSPITAL_COMMUNITY): Payer: Self-pay | Admitting: *Deleted

## 2012-05-08 DIAGNOSIS — K529 Noninfective gastroenteritis and colitis, unspecified: Secondary | ICD-10-CM

## 2012-05-08 DIAGNOSIS — R112 Nausea with vomiting, unspecified: Secondary | ICD-10-CM | POA: Insufficient documentation

## 2012-05-08 DIAGNOSIS — R51 Headache: Secondary | ICD-10-CM | POA: Insufficient documentation

## 2012-05-08 DIAGNOSIS — K5289 Other specified noninfective gastroenteritis and colitis: Secondary | ICD-10-CM | POA: Insufficient documentation

## 2012-05-08 DIAGNOSIS — IMO0001 Reserved for inherently not codable concepts without codable children: Secondary | ICD-10-CM | POA: Insufficient documentation

## 2012-05-08 DIAGNOSIS — R509 Fever, unspecified: Secondary | ICD-10-CM | POA: Insufficient documentation

## 2012-05-08 DIAGNOSIS — R1084 Generalized abdominal pain: Secondary | ICD-10-CM | POA: Insufficient documentation

## 2012-05-08 DIAGNOSIS — F17200 Nicotine dependence, unspecified, uncomplicated: Secondary | ICD-10-CM | POA: Insufficient documentation

## 2012-05-08 DIAGNOSIS — J45909 Unspecified asthma, uncomplicated: Secondary | ICD-10-CM | POA: Insufficient documentation

## 2012-05-08 DIAGNOSIS — Z8659 Personal history of other mental and behavioral disorders: Secondary | ICD-10-CM | POA: Insufficient documentation

## 2012-05-08 HISTORY — DX: Panic disorder (episodic paroxysmal anxiety): F41.0

## 2012-05-08 LAB — CBC WITH DIFFERENTIAL/PLATELET
Basophils Absolute: 0 10*3/uL (ref 0.0–0.1)
Eosinophils Absolute: 0 10*3/uL (ref 0.0–0.7)
Eosinophils Relative: 0 % (ref 0–5)
MCH: 31.8 pg (ref 26.0–34.0)
MCV: 91.8 fL (ref 78.0–100.0)
Platelets: 199 10*3/uL (ref 150–400)
RDW: 12.2 % (ref 11.5–15.5)
WBC: 7.9 10*3/uL (ref 4.0–10.5)

## 2012-05-08 LAB — COMPREHENSIVE METABOLIC PANEL
ALT: 12 U/L (ref 0–35)
AST: 16 U/L (ref 0–37)
Calcium: 9.6 mg/dL (ref 8.4–10.5)
Sodium: 136 mEq/L (ref 135–145)
Total Protein: 8.1 g/dL (ref 6.0–8.3)

## 2012-05-08 MED ORDER — SODIUM CHLORIDE 0.9 % IV BOLUS (SEPSIS)
1000.0000 mL | Freq: Once | INTRAVENOUS | Status: AC
  Administered 2012-05-08: 1000 mL via INTRAVENOUS

## 2012-05-08 MED ORDER — SODIUM CHLORIDE 0.9 % IV SOLN
INTRAVENOUS | Status: DC
  Administered 2012-05-08: 17:00:00 via INTRAVENOUS

## 2012-05-08 MED ORDER — ONDANSETRON HCL 4 MG/2ML IJ SOLN
4.0000 mg | Freq: Once | INTRAMUSCULAR | Status: AC
  Administered 2012-05-08: 4 mg via INTRAVENOUS

## 2012-05-08 MED ORDER — LOPERAMIDE HCL 2 MG PO TABS: 2.0000 mg | ORAL_TABLET | Freq: Four times a day (QID) | ORAL | Status: DC | PRN

## 2012-05-08 MED ORDER — PROMETHAZINE HCL 25 MG PO TABS: 25.0000 mg | ORAL_TABLET | Freq: Four times a day (QID) | ORAL | Status: DC | PRN

## 2012-05-08 MED ORDER — ONDANSETRON HCL 4 MG/2ML IJ SOLN
4.0000 mg | Freq: Once | INTRAMUSCULAR | Status: AC
  Administered 2012-05-08: 4 mg via INTRAVENOUS
  Filled 2012-05-08: qty 2

## 2012-05-08 MED ORDER — PROMETHAZINE HCL 25 MG/ML IJ SOLN
12.5000 mg | Freq: Once | INTRAMUSCULAR | Status: AC
  Administered 2012-05-08: 12.5 mg via INTRAVENOUS
  Filled 2012-05-08: qty 1

## 2012-05-08 NOTE — ED Provider Notes (Signed)
History  This chart was scribed for Shelda Jakes, MD by Bennett Scrape, ED Scribe. This patient was seen in room APA01/APA01 and the patient's care was started at 3:44 PM.  CSN: [id number]  Arrival date & time 05/08/12  1431   First MD Initiated Contact with Patient 05/08/12 1544      Chief Complaint  Patient presents with  . Influenza    Patient is a 27 y.o. female presenting with vomiting. The history is provided by the patient. No language interpreter was used.  Emesis Severity:  Mild Duration:  17 hours Timing:  Constant Quality:  Stomach contents Progression:  Unchanged Chronicity:  New Relieved by:  Nothing Worsened by:  Nothing tried Ineffective treatments:  None tried Associated symptoms: chills, diarrhea, headaches and myalgias   Associated symptoms: no abdominal pain and no sore throat     Kathy Robertson is a 27 y.o. female who presents to the Emergency Department complaining of 17 hours of gradual onset, non-changing, constant non-bloody emesis every 5 to 10 minutes with associated nausea, 7 episodes of non-bloody diarrhea, chills, subjective fevers, diffuse HA and myaglias. She denies having any sick contacts with similar symptoms. She has a h/o asthma and anxiety but denies having any prior abdominal surgeries. She is a current everyday smoker but denies alcohol use.  Past Medical History  Diagnosis Date  . Asthma   . Panic attacks anemia    History reviewed. No pertinent past surgical history.  No family history on file.  History  Substance Use Topics  . Smoking status: Current Every Day Smoker  . Smokeless tobacco: Not on file  . Alcohol Use: No    No OB history provided.  Review of Systems  Constitutional: Positive for fever and chills.  HENT: Negative for congestion, sore throat and rhinorrhea.   Eyes: Negative for visual disturbance.  Respiratory: Negative for cough and shortness of breath.   Cardiovascular: Negative for chest pain.   Gastrointestinal: Positive for nausea, vomiting and diarrhea. Negative for abdominal pain.  Genitourinary: Negative for dysuria and hematuria.  Musculoskeletal: Positive for myalgias.  Skin: Negative for rash.  Neurological: Positive for headaches. Negative for dizziness.  Hematological: Does not bruise/bleed easily.  All other systems reviewed and are negative.    Allergies  Tamiflu  Home Medications   Current Outpatient Rx  Name  Route  Sig  Dispense  Refill  . acetaminophen (TYLENOL) 500 MG tablet   Oral   Take 1,000 mg by mouth once as needed for pain.         Marland Kitchen loperamide (IMODIUM A-D) 2 MG tablet   Oral   Take 1 tablet (2 mg total) by mouth 4 (four) times daily as needed for diarrhea or loose stools.   30 tablet   0   . promethazine (PHENERGAN) 25 MG tablet   Oral   Take 1 tablet (25 mg total) by mouth every 6 (six) hours as needed for nausea.   12 tablet   0     Triage Vitals: BP 122/68  Pulse 94  Temp(Src) 98.5 F (36.9 C) (Axillary)  Resp 40  Ht 5\' 7"  (1.702 m)  Wt 170 lb (77.111 kg)  BMI 26.62 kg/m2  SpO2 100%  LMP 05/08/2012  Physical Exam  Nursing note and vitals reviewed. Constitutional: She is oriented to person, place, and time. She appears well-developed and well-nourished. No distress.  HENT:  Head: Normocephalic and atraumatic.  Mouth/Throat: Oropharynx is clear and moist.  Moist MM  Eyes: Conjunctivae and EOM are normal. Pupils are equal, round, and reactive to light.  Neck: Normal range of motion. Neck supple. No tracheal deviation present.  Cardiovascular: Normal rate and regular rhythm.   No murmur heard. Pulmonary/Chest: Effort normal and breath sounds normal. No respiratory distress.  Abdominal: Soft. Bowel sounds are normal. There is tenderness (diffuse).  Musculoskeletal: Normal range of motion. She exhibits no edema.  Neurological: She is alert and oriented to person, place, and time.  Pt able to move both sets of fingers and  toes  Skin: Skin is warm and dry.  Psychiatric: She has a normal mood and affect. Her behavior is normal.    ED Course  Procedures (including critical care time)  DIAGNOSTIC STUDIES: Oxygen Saturation is 100% on room air, normal by my interpretation.    COORDINATION OF CARE: 3:54 PM-Advised pt that her symptoms are mostly likely a GI virus and that the symptoms will improve after 24 hours. Discussed treatment plan which includes IV fluids, medications, and CBC panel with pt at bedside and pt agreed to plan.   4:15 PM- Ordered 1,000 mL of bolus, 4 mg Zofran injection and 12.5 mg phenergan injection  Labs Reviewed  CBC WITH DIFFERENTIAL - Abnormal; Notable for the following:    Neutrophils Relative 89 (*)    Lymphocytes Relative 7 (*)    Lymphs Abs 0.5 (*)    All other components within normal limits  COMPREHENSIVE METABOLIC PANEL - Abnormal; Notable for the following:    Potassium 3.4 (*)    Glucose, Bld 113 (*)    All other components within normal limits  LIPASE, BLOOD   Results for orders placed during the hospital encounter of 05/08/12  CBC WITH DIFFERENTIAL      Result Value Range   WBC 7.9  4.0 - 10.5 K/uL   RBC 4.37  3.87 - 5.11 MIL/uL   Hemoglobin 13.9  12.0 - 15.0 g/dL   HCT 28.4  13.2 - 44.0 %   MCV 91.8  78.0 - 100.0 fL   MCH 31.8  26.0 - 34.0 pg   MCHC 34.7  30.0 - 36.0 g/dL   RDW 10.2  72.5 - 36.6 %   Platelets 199  150 - 400 K/uL   Neutrophils Relative 89 (*) 43 - 77 %   Neutro Abs 7.0  1.7 - 7.7 K/uL   Lymphocytes Relative 7 (*) 12 - 46 %   Lymphs Abs 0.5 (*) 0.7 - 4.0 K/uL   Monocytes Relative 5  3 - 12 %   Monocytes Absolute 0.4  0.1 - 1.0 K/uL   Eosinophils Relative 0  0 - 5 %   Eosinophils Absolute 0.0  0.0 - 0.7 K/uL   Basophils Relative 0  0 - 1 %   Basophils Absolute 0.0  0.0 - 0.1 K/uL  COMPREHENSIVE METABOLIC PANEL      Result Value Range   Sodium 136  135 - 145 mEq/L   Potassium 3.4 (*) 3.5 - 5.1 mEq/L   Chloride 99  96 - 112 mEq/L   CO2  26  19 - 32 mEq/L   Glucose, Bld 113 (*) 70 - 99 mg/dL   BUN 10  6 - 23 mg/dL   Creatinine, Ser 4.40  0.50 - 1.10 mg/dL   Calcium 9.6  8.4 - 34.7 mg/dL   Total Protein 8.1  6.0 - 8.3 g/dL   Albumin 4.2  3.5 - 5.2 g/dL   AST 16  0 - 37  U/L   ALT 12  0 - 35 U/L   Alkaline Phosphatase 53  39 - 117 U/L   Total Bilirubin 0.6  0.3 - 1.2 mg/dL   GFR calc non Af Amer >90  >90 mL/min   GFR calc Af Amer >90  >90 mL/min  LIPASE, BLOOD      Result Value Range   Lipase 51  11 - 59 U/L       No results found.   1. Gastroenteritis       MDM  The patient improved significantly with fluids and antinausea medicine in the emergency department. Symptoms are consistent with a viral gastroenteritis. Nontoxic no acute distress.      I personally performed the services described in this documentation, which was scribed in my presence. The recorded information has been reviewed and is accurate.     Shelda Jakes, MD 05/08/12 [phone]

## 2012-05-08 NOTE — ED Notes (Signed)
Nausea and vomiting 

## 2012-05-08 NOTE — ED Notes (Signed)
Patient states she recently had an abortion

## 2013-01-21 ENCOUNTER — Emergency Department (HOSPITAL_COMMUNITY)
Admission: EM | Admit: 2013-01-21 | Discharge: 2013-01-21 | Disposition: A | Discharge status: Home / Self Care | Payer: Medicaid Other | Attending: Emergency Medicine | Admitting: Emergency Medicine

## 2013-01-21 ENCOUNTER — Emergency Department (HOSPITAL_COMMUNITY): Payer: Medicaid Other

## 2013-01-21 ENCOUNTER — Encounter (HOSPITAL_COMMUNITY): Payer: Self-pay | Admitting: Emergency Medicine

## 2013-01-21 DIAGNOSIS — R42 Dizziness and giddiness: Secondary | ICD-10-CM | POA: Insufficient documentation

## 2013-01-21 DIAGNOSIS — R040 Epistaxis: Secondary | ICD-10-CM | POA: Insufficient documentation

## 2013-01-21 DIAGNOSIS — Z8659 Personal history of other mental and behavioral disorders: Secondary | ICD-10-CM | POA: Insufficient documentation

## 2013-01-21 DIAGNOSIS — J3489 Other specified disorders of nose and nasal sinuses: Secondary | ICD-10-CM | POA: Insufficient documentation

## 2013-01-21 DIAGNOSIS — R079 Chest pain, unspecified: Secondary | ICD-10-CM

## 2013-01-21 DIAGNOSIS — R0789 Other chest pain: Secondary | ICD-10-CM | POA: Insufficient documentation

## 2013-01-21 DIAGNOSIS — J45909 Unspecified asthma, uncomplicated: Secondary | ICD-10-CM | POA: Insufficient documentation

## 2013-01-21 DIAGNOSIS — Z8701 Personal history of pneumonia (recurrent): Secondary | ICD-10-CM | POA: Insufficient documentation

## 2013-01-21 LAB — CBC
MCHC: 33.6 g/dL (ref 30.0–36.0)
RDW: 12.4 % (ref 11.5–15.5)

## 2013-01-21 LAB — COMPREHENSIVE METABOLIC PANEL
Albumin: 3.7 g/dL (ref 3.5–5.2)
Alkaline Phosphatase: 44 U/L (ref 39–117)
BUN: 12 mg/dL (ref 6–23)
Potassium: 4 mEq/L (ref 3.5–5.1)
Sodium: 138 mEq/L (ref 135–145)
Total Protein: 7.4 g/dL (ref 6.0–8.3)

## 2013-01-21 LAB — LIPASE, BLOOD: Lipase: 51 U/L (ref 11–59)

## 2013-01-21 MED ORDER — SUCRALFATE 1 G PO TABS: 1.0000 g | ORAL_TABLET | Freq: Four times a day (QID) | ORAL | Status: DC

## 2013-01-21 MED ORDER — FAMOTIDINE 20 MG PO TABS: 20.0000 mg | ORAL_TABLET | Freq: Two times a day (BID) | ORAL | Status: DC

## 2013-01-21 MED ORDER — FAMOTIDINE 20 MG PO TABS
20.0000 mg | ORAL_TABLET | Freq: Once | ORAL | Status: AC
  Administered 2013-01-21: 20 mg via ORAL
  Filled 2013-01-21: qty 1

## 2013-01-21 MED ORDER — GI COCKTAIL ~~LOC~~
30.0000 mL | Freq: Once | ORAL | Status: AC
  Administered 2013-01-21: 30 mL via ORAL
  Filled 2013-01-21: qty 30

## 2013-01-21 MED ORDER — SODIUM CHLORIDE 0.9 % IV SOLN
1000.0000 mL | INTRAVENOUS | Status: DC
  Administered 2013-01-21: 1000 mL via INTRAVENOUS

## 2013-01-21 MED ORDER — ASPIRIN 81 MG PO CHEW
324.0000 mg | CHEWABLE_TABLET | Freq: Once | ORAL | Status: AC
  Administered 2013-01-21: 324 mg via ORAL
  Filled 2013-01-21: qty 4

## 2013-01-21 NOTE — ED Notes (Signed)
Central sharp cp with sob and congestion x 3-4 days.  Reports pains come and go.  Also c/o left jaw pain and nose bleed this morning.  Bleeding controlled at this time.

## 2013-01-21 NOTE — ED Notes (Signed)
Pt stated she was feeling much better, stated she missed her court appearance this am, asked if staff could fax information to the courthouse, advised that we are not allowed to fax information, nodded in understanding.

## 2013-01-21 NOTE — ED Notes (Signed)
Pt reports mid-sternal cp for "awhile", also having nasal and chest congestion for several days. NAD in exam room.  Friend at bedside. resp even and unlabored.

## 2013-01-21 NOTE — ED Provider Notes (Signed)
CSN: [id number]     Arrival date & time 01/21/13  4098 History  This chart was scribed for Kathy Munch, MD by Quintella Reichert, ED scribe.  This patient was seen in room APA08/APA08 and the patient's care was started at 8:10 AM.   Chief Complaint  Patient presents with  . Chest Pain    The history is provided by the patient. No language interpreter was used.    HPI Comments: Kathy Robertson is a 27 y.o. female with h/o asthma and panic attacks who presents to the Emergency Department complaining of 2 weeks of worsening sharp intermittent chest pain.  Pt states she has had some chest pain on-and-off for several months but for the past 2 weeks it has become much more severe and has gone from a mild "aching" pain to a "stabbing, sharp" pain.  It comes on intermittently and she denies any known precipitating factors and states it can occur while standing or at rest.  She attempted to treat symptoms with antacids and states her pain resolved temporarily before it returned.  She states that also she has some difficulty breathing when her pain is present but she denies SOB at other times.  Pt also states she may be getting a cold and complains of left-sided sinus pressure for the past 3-4 days.  She also notes that she awoke with a nosebleed with morning which has since resolved.  She states she has also been slightly lightheaded.  She denies syncope, fevers, swelling, rash, confusion, or any other associated symptoms.  She has no known personal h/o heart issue but states that her mother died of "heart failure" and her father also has "heart problems."  She admits to h/o pneumonia.  She last saw her PCP last year.  She receives psychiatric medications from [facility] but she states these medications "make her feel weird" and "zombie-like" so she does not take them as often as instructed.  She does not smoke or drink.   Past Medical History  Diagnosis Date  . Asthma   . Panic attacks anemia     History reviewed. No pertinent past surgical history.  No family history on file.   History  Substance Use Topics  . Smoking status: Never Smoker   . Smokeless tobacco: Not on file  . Alcohol Use: No    OB History   Grav Para Term Preterm Abortions TAB SAB Ect Mult Living                  Review of Systems  Constitutional:       Per HPI, otherwise negative  HENT:       Per HPI, otherwise negative  Respiratory:       Per HPI, otherwise negative  Cardiovascular:       Per HPI, otherwise negative  Gastrointestinal: Negative for vomiting.  Endocrine:       Negative aside from HPI  Genitourinary:       Neg aside from HPI   Musculoskeletal:       Per HPI, otherwise negative  Skin: Negative.   Neurological: Negative for syncope.     Allergies  Tamiflu  Home Medications   Current Outpatient Rx  Name  Route  Sig  Dispense  Refill  . acetaminophen (TYLENOL) 500 MG tablet   Oral   Take 1,000 mg by mouth once as needed for pain.         Marland Kitchen loperamide (IMODIUM A-D) 2 MG tablet   Oral  Take 1 tablet (2 mg total) by mouth 4 (four) times daily as needed for diarrhea or loose stools.   30 tablet   0   . promethazine (PHENERGAN) 25 MG tablet   Oral   Take 1 tablet (25 mg total) by mouth every 6 (six) hours as needed for nausea.   12 tablet   0    BP 116/61  Pulse 71  Temp(Src) 97.8 F (36.6 C) (Oral)  Resp 16  Ht 5\' 7"  (1.702 m)  Wt 165 lb (74.844 kg)  BMI 25.84 kg/m2  LMP 01/14/2013  Physical Exam  Nursing note and vitals reviewed. Constitutional: She is oriented to person, place, and time. She appears well-developed and well-nourished. No distress.  HENT:  Head: Normocephalic and atraumatic.  Eyes: Conjunctivae and EOM are normal.  Cardiovascular: Normal rate, regular rhythm and normal heart sounds.   No murmur heard. Pulmonary/Chest: Effort normal and breath sounds normal. No stridor. No respiratory distress. She has no wheezes. She has no  rales.  Abdominal: She exhibits no distension.  Musculoskeletal: She exhibits no edema.  Neurological: She is alert and oriented to person, place, and time. No cranial nerve deficit.  Skin: Skin is warm and dry.  Psychiatric: She has a normal mood and affect.    ED Course  Procedures (including critical care time)   COORDINATION OF CARE: 8:20 AM-Discussed treatment plan which includes cardiac workup with pt at bedside and pt agreed to plan.    Labs Review Labs Reviewed - No data to display  Imaging Review No results found.  EKG Interpretation    Date/Time:  Thursday January 21 2013 08:14:55 EST Ventricular Rate:  65 PR Interval:  156 QRS Duration: 82 QT Interval:  392 QTC Calculation: 407 R Axis:   76 Text Interpretation:  Normal sinus rhythm with sinus arrhythmia Normal ECG No previous ECGs available Confirmed by Kathy Munch  MD ([id number]) on 01/21/2013 8:44:52 AM            10:30 AM On repeat exam the patient appears comfortable.  We discussed all results.  The need for medication compliance, with outpatient followup next week. MDM  No diagnosis found. This generally well-appearing female presents with intermittent chest pain, but has been present for months, though worse over the past days.  On exam the patient is in no distress, speaking clearly, with normal vital signs, no evidence of decompensation.  The patient's presentation is consistent with either gastroesophageal or musculoskeletal etiology given the reassuring workup, absence of risk factors.  After lengthy conversation on the need for primary care followup, return precautions, she was discharged in stable condition.   Kathy Munch, MD 01/21/13 1032

## 2014-07-12 ENCOUNTER — Emergency Department (HOSPITAL_COMMUNITY): Payer: Self-pay

## 2014-07-12 ENCOUNTER — Emergency Department (HOSPITAL_COMMUNITY): Payer: Medicaid Other

## 2014-07-12 ENCOUNTER — Encounter (HOSPITAL_COMMUNITY): Payer: Self-pay

## 2014-07-12 ENCOUNTER — Emergency Department (HOSPITAL_COMMUNITY)
Admission: EM | Admit: 2014-07-12 | Discharge: 2014-07-12 | Disposition: A | Discharge status: Home / Self Care | Payer: Self-pay | Attending: Emergency Medicine | Admitting: Emergency Medicine

## 2014-07-12 DIAGNOSIS — R079 Chest pain, unspecified: Secondary | ICD-10-CM | POA: Insufficient documentation

## 2014-07-12 DIAGNOSIS — R0602 Shortness of breath: Secondary | ICD-10-CM

## 2014-07-12 DIAGNOSIS — J45901 Unspecified asthma with (acute) exacerbation: Secondary | ICD-10-CM | POA: Insufficient documentation

## 2014-07-12 DIAGNOSIS — Z3202 Encounter for pregnancy test, result negative: Secondary | ICD-10-CM | POA: Insufficient documentation

## 2014-07-12 DIAGNOSIS — F410 Panic disorder [episodic paroxysmal anxiety] without agoraphobia: Secondary | ICD-10-CM | POA: Insufficient documentation

## 2014-07-12 DIAGNOSIS — Z79899 Other long term (current) drug therapy: Secondary | ICD-10-CM | POA: Insufficient documentation

## 2014-07-12 DIAGNOSIS — J069 Acute upper respiratory infection, unspecified: Secondary | ICD-10-CM | POA: Insufficient documentation

## 2014-07-12 LAB — COMPREHENSIVE METABOLIC PANEL
ALBUMIN: 4.4 g/dL (ref 3.5–5.0)
ALT: 15 U/L (ref 14–54)
ANION GAP: 14 (ref 5–15)
AST: 28 U/L (ref 15–41)
Alkaline Phosphatase: 51 U/L (ref 38–126)
BILIRUBIN TOTAL: 0.7 mg/dL (ref 0.3–1.2)
BUN: 9 mg/dL (ref 6–20)
CALCIUM: 9 mg/dL (ref 8.9–10.3)
CHLORIDE: 100 mmol/L — AB (ref 101–111)
CO2: 21 mmol/L — AB (ref 22–32)
CREATININE: 0.93 mg/dL (ref 0.44–1.00)
GFR calc Af Amer: >60 mL/min (ref >60)
Glucose, Bld: 76 mg/dL (ref 70–99)
POTASSIUM: 3.7 mmol/L (ref 3.5–5.1)
SODIUM: 135 mmol/L (ref 135–145)
Total Protein: 8.4 g/dL — ABNORMAL HIGH (ref 6.5–8.1)

## 2014-07-12 LAB — CBC WITH DIFFERENTIAL/PLATELET
BASOS PCT: 0 % (ref 0–1)
Basophils Absolute: 0 10*3/uL (ref 0.0–0.1)
EOS ABS: 0 10*3/uL (ref 0.0–0.7)
Eosinophils Relative: 0 % (ref 0–5)
HEMATOCRIT: 39.6 % (ref 36.0–46.0)
HEMOGLOBIN: 13.6 g/dL (ref 12.0–15.0)
Lymphocytes Relative: 28 % (ref 12–46)
Lymphs Abs: 1.4 10*3/uL (ref 0.7–4.0)
MCH: 31.1 pg (ref 26.0–34.0)
MCHC: 34.3 g/dL (ref 30.0–36.0)
MCV: 90.6 fL (ref 78.0–100.0)
MONO ABS: 0.7 10*3/uL (ref 0.1–1.0)
MONOS PCT: 14 % — AB (ref 3–12)
Neutro Abs: 3 10*3/uL (ref 1.7–7.7)
Neutrophils Relative %: 58 % (ref 43–77)
Platelets: 186 10*3/uL (ref 150–400)
RBC: 4.37 MIL/uL (ref 3.87–5.11)
RDW: 12.4 % (ref 11.5–15.5)
WBC: 5.1 10*3/uL (ref 4.0–10.5)

## 2014-07-12 LAB — URINALYSIS, ROUTINE W REFLEX MICROSCOPIC
Glucose, UA: NEGATIVE mg/dL
Ketones, ur: >80 mg/dL — AB
Leukocytes, UA: NEGATIVE
Nitrite: NEGATIVE
Protein, ur: 100 mg/dL — AB
Specific Gravity, Urine: >1.03 — ABNORMAL HIGH (ref 1.005–1.030)
Urobilinogen, UA: 1 mg/dL (ref 0.0–1.0)
pH: 5.5 (ref 5.0–8.0)

## 2014-07-12 LAB — LIPASE, BLOOD: Lipase: 37 U/L (ref 22–51)

## 2014-07-12 LAB — URINE MICROSCOPIC-ADD ON

## 2014-07-12 LAB — D-DIMER, QUANTITATIVE (NOT AT ARMC): D DIMER QUANT: 0.92 ug{FEU}/mL — AB (ref 0.00–0.48)

## 2014-07-12 LAB — PREGNANCY, URINE: Preg Test, Ur: NEGATIVE

## 2014-07-12 MED ORDER — LORAZEPAM 2 MG/ML IJ SOLN
1.0000 mg | Freq: Once | INTRAMUSCULAR | Status: AC
  Administered 2014-07-12: 1 mg via INTRAVENOUS
  Filled 2014-07-12: qty 1

## 2014-07-12 MED ORDER — SODIUM CHLORIDE 0.9 % IV BOLUS (SEPSIS)
1000.0000 mL | Freq: Once | INTRAVENOUS | Status: AC
Start: 2014-07-12 — End: 2014-07-12
  Administered 2014-07-12: 1000 mL via INTRAVENOUS

## 2014-07-12 MED ORDER — ALBUTEROL SULFATE HFA 108 (90 BASE) MCG/ACT IN AERS
2.0000 | INHALATION_SPRAY | Freq: Once | RESPIRATORY_TRACT | Status: AC
  Administered 2014-07-12: 2 via RESPIRATORY_TRACT
  Filled 2014-07-12: qty 6.7

## 2014-07-12 MED ORDER — ACETAMINOPHEN 500 MG PO TABS
1000.0000 mg | ORAL_TABLET | Freq: Once | ORAL | Status: AC
  Administered 2014-07-12: 1000 mg via ORAL
  Filled 2014-07-12: qty 2

## 2014-07-12 MED ORDER — NAPROXEN 500 MG PO TABS
500.0000 mg | ORAL_TABLET | Freq: Two times a day (BID) | ORAL | Status: DC
Start: 2014-07-12 — End: 2017-08-09

## 2014-07-12 MED ORDER — ONDANSETRON HCL 4 MG/2ML IJ SOLN
4.0000 mg | Freq: Once | INTRAMUSCULAR | Status: AC
  Administered 2014-07-12: 4 mg via INTRAVENOUS
  Filled 2014-07-12: qty 2

## 2014-07-12 MED ORDER — AEROCHAMBER Z-STAT PLUS/MEDIUM MISC: 1.0000 | Freq: Once | Status: DC

## 2014-07-12 MED ORDER — SODIUM CHLORIDE 0.9 % IV BOLUS (SEPSIS)
1000.0000 mL | Freq: Once | INTRAVENOUS | Status: AC
  Administered 2014-07-12: 1000 mL via INTRAVENOUS

## 2014-07-12 MED ORDER — ONDANSETRON 4 MG PO TBDP: 4.0000 mg | ORAL_TABLET | Freq: Once | ORAL | Status: DC

## 2014-07-12 MED ORDER — ALBUTEROL SULFATE HFA 108 (90 BASE) MCG/ACT IN AERS: 2.0000 | INHALATION_SPRAY | RESPIRATORY_TRACT | Status: DC | PRN

## 2014-07-12 MED ORDER — BENZONATATE 100 MG PO CAPS: 100.0000 mg | ORAL_CAPSULE | Freq: Three times a day (TID) | ORAL | Status: DC

## 2014-07-12 MED ORDER — IOHEXOL 350 MG/ML SOLN
100.0000 mL | Freq: Once | INTRAVENOUS | Status: AC | PRN
  Administered 2014-07-12: 100 mL via INTRAVENOUS

## 2014-07-12 NOTE — Discharge Instructions (Signed)
Your CT scan was normal  Take medicines as prescribed  Tessalon for coughing Naprosyn for pain Albuterol inhaler for wheezing / cough.  Please call your doctor for a followup appointment within 24-48 hours. When you talk to your doctor please let them know that you were seen in the emergency department and have them acquire all of your records so that they can discuss the findings with you and formulate a treatment plan to fully care for your new and ongoing problems.

## 2014-07-12 NOTE — ED Provider Notes (Signed)
CSN: [id number]     Arrival date & time 07/12/14  1104 History  This chart was scribed for Eber HongBrian Ornella Coderre, MD by Tanda RockersMargaux Venter, ED Scribe. This patient was seen in room APA05/APA05 and the patient's care was started at 11:48 AM.   Chief Complaint  Patient presents with  . Emesis   The history is provided by the patient. No language interpreter was used.     HPI Comments: Kathy Robertson is a 29 y.o. female with hx Bipolar disorder who presents to the Emergency Department complaining of sudden onset chest pain that began 1 day ago. Pt mentions that she has had a cough for awhile and that it got worse last night. She also complains of chills, myalgias, nausea, vomiting, and shortness of breath. She reports that she was vomiting up stomach acid and describes it as "bubbles." Denies having flights of ideas. No recent sick contact with similar symptoms. No hx DVT or PE. Pt did not receive flu shot this year.    Past Medical History  Diagnosis Date  . Asthma   . Panic attacks anemia   History reviewed. No pertinent past surgical history. No family history on file. History  Substance Use Topics  . Smoking status: Never Smoker   . Smokeless tobacco: Not on file  . Alcohol Use: No   OB History    No data available     Review of Systems  All other systems reviewed and are negative.     Allergies  Tamiflu  Home Medications   Prior to Admission medications   Medication Sig Start Date End Date Taking? Authorizing Provider  acetaminophen (TYLENOL) 500 MG tablet Take 1,000 mg by mouth once as needed for pain.   Yes Historical Provider, MD  busPIRone (BUSPAR) 5 MG tablet Take 5 mg by mouth daily.    Yes Historical Provider, MD  citalopram (CELEXA) 40 MG tablet Take 40 mg by mouth daily.   Yes Historical Provider, MD  risperiDONE (RISPERDAL) 1 MG tablet Take 1 mg by mouth at bedtime.    Yes Historical Provider, MD  sertraline (ZOLOFT) 100 MG tablet Take 100 mg by mouth daily.   Yes  Historical Provider, MD  traZODone (DESYREL) 100 MG tablet Take 50 mg by mouth at bedtime as needed for sleep.   Yes Historical Provider, MD  famotidine (PEPCID) 20 MG tablet Take 1 tablet (20 mg total) by mouth 2 (two) times daily. Patient not taking: Reported on 07/12/2014 01/21/13   Gerhard Munchobert Lockwood, MD  sucralfate (CARAFATE) 1 G tablet Take 1 tablet (1 g total) by mouth 4 (four) times daily. Patient not taking: Reported on 07/12/2014 01/21/13   Gerhard Munchobert Lockwood, MD   Triage Vitals: BP 111/61 mmHg  Pulse 95  Temp(Src) 99.4 F (37.4 C) (Oral)  Resp 32  Ht 5\' 7"  (1.702 m)  Wt 175 lb (79.379 kg)  BMI 27.40 kg/m2  SpO2 100%  LMP 07/06/2014   Physical Exam  Constitutional: She appears well-developed and well-nourished. No distress.  HENT:  Head: Normocephalic and atraumatic.  Mouth/Throat: No oropharyngeal exudate.  Oropharynx erythematous but no exudate.   Eyes: Conjunctivae and EOM are normal. Pupils are equal, round, and reactive to light. Right eye exhibits no discharge. Left eye exhibits no discharge. No scleral icterus.  Neck: Normal range of motion. Neck supple. No JVD present. No thyromegaly present.  Cardiovascular: Normal rate, regular rhythm, normal heart sounds and intact distal pulses.  Exam reveals no gallop and no friction rub.  No murmur heard. Pulmonary/Chest: Breath sounds normal. Tachypnea noted. No respiratory distress. She has no wheezes. She has no rales.  Shallow breathing.  Tachypneic.  Reproducible tenderness over chest wall.  Clear lungs.    Abdominal: Soft. Bowel sounds are normal. She exhibits no distension and no mass. There is no tenderness.  No abdominal tenderness.   Musculoskeletal: Normal range of motion. She exhibits no edema or tenderness.  No peripheral edema or asymmetry of the legs.   Lymphadenopathy:    She has no cervical adenopathy.  Neurological: She is alert. Coordination normal.  Skin: Skin is warm and dry. No rash noted. No erythema.   Psychiatric: She has a normal mood and affect. Her behavior is normal.  Nursing note and vitals reviewed.   ED Course  Procedures (including critical care time)  DIAGNOSTIC STUDIES: Oxygen Saturation is 100% on RA, normal by my interpretation.    COORDINATION OF CARE: 11:57 AM-Discussed treatment plan which includes CXR, CBC, CMP, Lipase, UA, Urine pregnancy with pt at bedside and pt agreed to plan.   Labs Review Labs Reviewed  URINALYSIS, ROUTINE W REFLEX MICROSCOPIC - Abnormal; Notable for the following:    Color, Urine AMBER (*)    Specific Gravity, Urine >1.030 (*)    Hgb urine dipstick LARGE (*)    Bilirubin Urine MODERATE (*)    Ketones, ur >80 (*)    Protein, ur 100 (*)    All other components within normal limits  URINE MICROSCOPIC-ADD ON - Abnormal; Notable for the following:    Squamous Epithelial / LPF MANY (*)    Bacteria, UA FEW (*)    All other components within normal limits  PREGNANCY, URINE  CBC WITH DIFFERENTIAL/PLATELET  COMPREHENSIVE METABOLIC PANEL  LIPASE, BLOOD    Imaging Review No results found.  The pt has normal xray, labs show borderline elevated d dimer - no other acute findings - has been resting without distress unles you are in the room when she becomes anxious and hyperventilates  CT findings show no PE - no other acute findings.  Pt informed, stable for d/c with meds as below.  MDM   Final diagnoses:  SOB (shortness of breath)   ;I personally performed the services described in this documentation, which was scribed in my presence. The recorded information has been reviewed and is accurate.   Meds given in ED:  Medications  ondansetron (ZOFRAN-ODT) disintegrating tablet 4 mg (4 mg Oral Not Given 07/12/14 1201)  aerochamber Z-Stat Plus/medium 1 each (not administered)  ondansetron (ZOFRAN) injection 4 mg (4 mg Intravenous Given 07/12/14 1219)  sodium chloride 0.9 % bolus 1,000 mL (0 mLs Intravenous Stopped 07/12/14 1345)  sodium  chloride 0.9 % bolus 1,000 mL (0 mLs Intravenous Stopped 07/12/14 1415)  LORazepam (ATIVAN) injection 1 mg (1 mg Intravenous Given 07/12/14 1243)  iohexol (OMNIPAQUE) 350 MG/ML injection 100 mL (100 mLs Intravenous Contrast Given 07/12/14 1434)  albuterol (PROVENTIL HFA;VENTOLIN HFA) 108 (90 BASE) MCG/ACT inhaler 2 puff (2 puffs Inhalation Given 07/12/14 1600)  acetaminophen (TYLENOL) tablet 1,000 mg (1,000 mg Oral Given 07/12/14 1603)    Discharge Medication List as of 07/12/2014  3:22 PM    START taking these medications   Details  albuterol (PROVENTIL HFA;VENTOLIN HFA) 108 (90 BASE) MCG/ACT inhaler Inhale 2 puffs into the lungs every 4 (four) hours as needed for wheezing or shortness of breath., Starting 07/12/2014, Until Discontinued, Print    benzonatate (TESSALON) 100 MG capsule Take 1 capsule (100 mg total) by  mouth every 8 (eight) hours., Starting 07/12/2014, Until Discontinued, Print    naproxen (NAPROSYN) 500 MG tablet Take 1 tablet (500 mg total) by mouth 2 (two) times daily with a meal., Starting 07/12/2014, Until Discontinued, Print             Eber HongBrian Myana Schlup, MD 07/12/14 1654

## 2014-07-12 NOTE — ED Notes (Signed)
Body aches, cold chills, emesis since yesterday.

## 2016-12-24 ENCOUNTER — Encounter (HOSPITAL_COMMUNITY): Payer: Self-pay | Admitting: *Deleted

## 2016-12-24 ENCOUNTER — Emergency Department (HOSPITAL_COMMUNITY): Payer: Self-pay

## 2016-12-24 ENCOUNTER — Emergency Department (HOSPITAL_COMMUNITY)
Admission: EM | Admit: 2016-12-24 | Discharge: 2016-12-24 | Disposition: A | Discharge status: Home / Self Care | Payer: Self-pay | Attending: Emergency Medicine | Admitting: Emergency Medicine

## 2016-12-24 DIAGNOSIS — J45909 Unspecified asthma, uncomplicated: Secondary | ICD-10-CM | POA: Insufficient documentation

## 2016-12-24 DIAGNOSIS — R197 Diarrhea, unspecified: Secondary | ICD-10-CM | POA: Insufficient documentation

## 2016-12-24 DIAGNOSIS — R05 Cough: Secondary | ICD-10-CM | POA: Insufficient documentation

## 2016-12-24 DIAGNOSIS — Z79899 Other long term (current) drug therapy: Secondary | ICD-10-CM | POA: Insufficient documentation

## 2016-12-24 DIAGNOSIS — R69 Illness, unspecified: Secondary | ICD-10-CM | POA: Insufficient documentation

## 2016-12-24 DIAGNOSIS — J111 Influenza due to unidentified influenza virus with other respiratory manifestations: Secondary | ICD-10-CM

## 2016-12-24 DIAGNOSIS — R51 Headache: Secondary | ICD-10-CM | POA: Insufficient documentation

## 2016-12-24 MED ORDER — ACETAMINOPHEN 325 MG PO TABS
650.0000 mg | ORAL_TABLET | Freq: Once | ORAL | Status: AC
  Administered 2016-12-24: 650 mg via ORAL
  Filled 2016-12-24: qty 2

## 2016-12-24 NOTE — ED Triage Notes (Signed)
Pt c/o generalized body aches x 4 days with chills

## 2016-12-24 NOTE — ED Provider Notes (Signed)
[facility] Provider Note   CSN: [id number] Arrival date & time: 12/24/16  [id number]     History   Chief Complaint Chief Complaint  Patient presents with  . Generalized Body Aches    HPI Kathy Robertson is a 31 y.o. female.  Patient presents with a 5-day history of influenza-like illness with body aches, chills, headache, sore throat, nausea and cough.  She has also had a few episodes of diarrhea.  Has been taking ibuprofen without relief.  She has had chills and her highest temperature at home is been 100 degrees.  Has had sick contacts at home with her daughter.  Did not receive a flu shot this year.  Denies any chronic medical problems or medication use.  Denies any chest pain or shortness of breath but denies any abdominal pain.  Poor appetite.  No pain with urination or blood in the urine.   The history is provided by the patient.    Past Medical History:  Diagnosis Date  . Asthma   . Panic attacks anemia    There are no active problems to display for this patient.   History reviewed. No pertinent surgical history.  OB History    No data available       Home Medications    Prior to Admission medications   Medication Sig Start Date End Date Taking? Authorizing Provider  carbamazepine (TEGRETOL XR) 200 MG 12 hr tablet Take 600 mg by mouth 2 (two) times daily.   Yes [provider]  Mirtazapine (REMERON PO) Take by mouth at bedtime.   Yes [provider]  sertraline (ZOLOFT) 100 MG tablet Take 150 mg by mouth daily.    Yes [provider]  acetaminophen (TYLENOL) 500 MG tablet Take 1,000 mg by mouth once as needed for pain.    [provider]  naproxen (NAPROSYN) 500 MG tablet Take 1 tablet (500 mg total) by mouth 2 (two) times daily with a meal. 07/12/14   Eber HongMiller, Brian, MD    Family History History reviewed. No pertinent family history.  Social History Social History  Substance Use Topics  . Smoking  status: Never Smoker  . Smokeless tobacco: Never Used  . Alcohol use No     Allergies   Tamiflu   Review of Systems Review of Systems  Constitutional: Positive for activity change, appetite change, chills and fatigue. Negative for fever.  HENT: Positive for congestion and rhinorrhea. Negative for sore throat and trouble swallowing.   Eyes: Negative for visual disturbance.  Respiratory: Positive for cough. Negative for shortness of breath.   Cardiovascular: Negative for chest pain.  Gastrointestinal: Negative for abdominal pain, nausea and vomiting.  Genitourinary: Negative for dysuria and hematuria.  Musculoskeletal: Positive for arthralgias and myalgias.  Neurological: Positive for weakness and headaches. Negative for dizziness.    all other systems are negative except as noted in the HPI and PMH.    Physical Exam Updated Vital Signs BP (!) 124/91 (BP Location: Right Arm)   Pulse 89   Temp 97.6 F (36.4 C) (Oral)   Resp 20   Ht 5\' 7"  (1.702 m)   Wt 97.5 kg (215 lb)   LMP 12/07/2016   SpO2 98%   BMI 33.67 kg/m   Physical Exam  Constitutional: She is oriented to person, place, and time. She appears well-developed and well-nourished. No distress.  HENT:  Head: Normocephalic and atraumatic.  Mouth/Throat: Oropharynx is clear and moist. No oropharyngeal exudate.  Eyes: Pupils  are equal, round, and reactive to light. Conjunctivae and EOM are normal.  Neck: Normal range of motion. Neck supple.  No meningismus.  Cardiovascular: Normal rate, regular rhythm, normal heart sounds and intact distal pulses.   No murmur heard. Pulmonary/Chest: Effort normal and breath sounds normal. No respiratory distress. She exhibits no tenderness.  Abdominal: Soft. There is no tenderness. There is no rebound and no guarding.  Musculoskeletal: Normal range of motion. She exhibits no edema or tenderness.  Neurological: She is alert and oriented to person, place, and time. No cranial nerve  deficit. She exhibits normal muscle tone. Coordination normal.   5/5 strength throughout. CN 2-12 intact.Equal grip strength.   Skin: Skin is warm. Capillary refill takes less than 2 seconds.  Psychiatric: She has a normal mood and affect. Her behavior is normal.  Nursing note and vitals reviewed.    ED Treatments / Results  Labs (all labs ordered are listed, but only abnormal results are displayed) Labs Reviewed - No data to display  EKG  EKG Interpretation None       Radiology Dg Chest 2 View  Result Date: 12/24/2016 CLINICAL DATA:  31 y/o  F; cough.  History of pneumonia and asthma. EXAM: CHEST  2 VIEW COMPARISON:  07/12/2014 chest radiograph FINDINGS: Stable heart size and mediastinal contours are within normal limits. Both lungs are clear. The visualized skeletal structures are unremarkable. IMPRESSION: No active cardiopulmonary disease. Electronically Signed   By: Mitzi Hansen M.D.   On: 12/24/2016 02:16    Procedures Procedures (including critical care time)  Medications Ordered in ED Medications  acetaminophen (TYLENOL) tablet 650 mg (not administered)     Initial Impression / Assessment and Plan / ED Course  I have reviewed the triage vital signs and the nursing notes.  Pertinent labs & imaging results that were available during my care of the patient were reviewed by me and considered in my medical decision making (see chart for details).    Influenza-like illness for 5 days.  Nontoxic with stable vital signs and afebrile.  Discussed supportive care including p.o. hydration, antipyretics.  No role for Tamiflu as patient is beyond 72-hour treatment window.  She also reports allergy to Tamiflu.  Chest x-ray negative.  Supportive care discussed.  PCP follow-up discussed, return precautions discussed. Final Clinical Impressions(s) / ED Diagnoses   Final diagnoses:  Influenza-like illness    New Prescriptions New Prescriptions   No medications  on file     Glynn Octave, MD 12/24/16 [phone]

## 2017-02-06 ENCOUNTER — Emergency Department (HOSPITAL_COMMUNITY)
Admission: EM | Admit: 2017-02-06 | Discharge: 2017-02-06 | Disposition: A | Discharge status: Home / Self Care | Payer: Self-pay | Attending: Emergency Medicine | Admitting: Emergency Medicine

## 2017-02-06 ENCOUNTER — Encounter (HOSPITAL_COMMUNITY): Payer: Self-pay | Admitting: Emergency Medicine

## 2017-02-06 DIAGNOSIS — K529 Noninfective gastroenteritis and colitis, unspecified: Secondary | ICD-10-CM | POA: Insufficient documentation

## 2017-02-06 DIAGNOSIS — J45909 Unspecified asthma, uncomplicated: Secondary | ICD-10-CM | POA: Insufficient documentation

## 2017-02-06 DIAGNOSIS — Z79899 Other long term (current) drug therapy: Secondary | ICD-10-CM | POA: Insufficient documentation

## 2017-02-06 DIAGNOSIS — R1110 Vomiting, unspecified: Secondary | ICD-10-CM

## 2017-02-06 LAB — CBC WITH DIFFERENTIAL/PLATELET
BASOS PCT: 0 %
Basophils Absolute: 0 10*3/uL (ref 0.0–0.1)
Eosinophils Absolute: 0 10*3/uL (ref 0.0–0.7)
Eosinophils Relative: 0 %
HEMATOCRIT: 39.4 % (ref 36.0–46.0)
Hemoglobin: 12.7 g/dL (ref 12.0–15.0)
Lymphocytes Relative: 16 %
Lymphs Abs: 1.4 10*3/uL (ref 0.7–4.0)
MCH: 30.5 pg (ref 26.0–34.0)
MCHC: 32.2 g/dL (ref 30.0–36.0)
MCV: 94.7 fL (ref 78.0–100.0)
MONO ABS: 0.4 10*3/uL (ref 0.1–1.0)
MONOS PCT: 4 %
NEUTROS ABS: 7.1 10*3/uL (ref 1.7–7.7)
Neutrophils Relative %: 80 %
Platelets: 299 10*3/uL (ref 150–400)
RBC: 4.16 MIL/uL (ref 3.87–5.11)
RDW: 12.9 % (ref 11.5–15.5)
WBC: 8.9 10*3/uL (ref 4.0–10.5)

## 2017-02-06 LAB — COMPREHENSIVE METABOLIC PANEL
ALT: 16 U/L (ref 14–54)
ANION GAP: 10 (ref 5–15)
AST: 20 U/L (ref 15–41)
Albumin: 4.3 g/dL (ref 3.5–5.0)
Alkaline Phosphatase: 63 U/L (ref 38–126)
BILIRUBIN TOTAL: 0.9 mg/dL (ref 0.3–1.2)
BUN: 10 mg/dL (ref 6–20)
CHLORIDE: 100 mmol/L — AB (ref 101–111)
CO2: 26 mmol/L (ref 22–32)
Calcium: 9.5 mg/dL (ref 8.9–10.3)
Creatinine, Ser: 0.86 mg/dL (ref 0.44–1.00)
GFR calc Af Amer: >60 mL/min (ref >60)
GFR calc non Af Amer: >60 mL/min (ref >60)
Glucose, Bld: 80 mg/dL (ref 65–99)
Potassium: 3.9 mmol/L (ref 3.5–5.1)
Sodium: 136 mmol/L (ref 135–145)
TOTAL PROTEIN: 8.6 g/dL — AB (ref 6.5–8.1)

## 2017-02-06 LAB — LIPASE, BLOOD: Lipase: 43 U/L (ref 11–51)

## 2017-02-06 LAB — URINALYSIS, ROUTINE W REFLEX MICROSCOPIC
Bilirubin Urine: NEGATIVE
Glucose, UA: NEGATIVE mg/dL
Hgb urine dipstick: NEGATIVE
KETONES UR: 20 mg/dL — AB
Leukocytes, UA: NEGATIVE
NITRITE: NEGATIVE
Protein, ur: NEGATIVE mg/dL
Specific Gravity, Urine: 1.023 (ref 1.005–1.030)
pH: 7 (ref 5.0–8.0)

## 2017-02-06 LAB — PREGNANCY, URINE: PREG TEST UR: NEGATIVE

## 2017-02-06 MED ORDER — ONDANSETRON 4 MG PO TBDP: 4.0000 mg | ORAL_TABLET | Freq: Three times a day (TID) | ORAL | 0 refills | Status: DC | PRN

## 2017-02-06 MED ORDER — PROMETHAZINE HCL 25 MG PO TABS: 25.0000 mg | ORAL_TABLET | Freq: Four times a day (QID) | ORAL | 0 refills | Status: DC | PRN

## 2017-02-06 MED ORDER — DIPHENOXYLATE-ATROPINE 2.5-0.025 MG PO TABS: 1.0000 | ORAL_TABLET | Freq: Four times a day (QID) | ORAL | 0 refills | Status: DC | PRN

## 2017-02-06 MED ORDER — DIPHENOXYLATE-ATROPINE 2.5-0.025 MG PO TABS
2.0000 | ORAL_TABLET | Freq: Once | ORAL | Status: AC
  Administered 2017-02-06: 2 via ORAL
  Filled 2017-02-06: qty 2

## 2017-02-06 MED ORDER — SODIUM CHLORIDE 0.9 % IV BOLUS (SEPSIS)
1000.0000 mL | Freq: Once | INTRAVENOUS | Status: AC
  Administered 2017-02-06: 1000 mL via INTRAVENOUS

## 2017-02-06 MED ORDER — ONDANSETRON HCL 4 MG/2ML IJ SOLN
4.0000 mg | Freq: Once | INTRAMUSCULAR | Status: AC
  Administered 2017-02-06: 4 mg via INTRAVENOUS
  Filled 2017-02-06: qty 2

## 2017-02-06 NOTE — Discharge Instructions (Signed)
Take the medicines as prescribed if needed for continued symptoms.  Only continue to take Lomotil if you continued to have diarrhea as this medication will make you fairly constipated if you continue taking it once your diarrhea is improved.  Rest and make sure you are drinking plenty of fluids.  Remember the brat diet which stands for bananas rice applesauce and toast.

## 2017-02-06 NOTE — ED Triage Notes (Signed)
Pt states she woke up this morning with n/v/d.  States she has been having chills all day as well.

## 2017-02-06 NOTE — ED Provider Notes (Signed)
[facility] Provider Note   CSN: [id number] Arrival date & time: 02/06/17  1246     History   Chief Complaint Chief Complaint  Patient presents with  . Emesis  . Diarrhea    HPI Kathy Robertson is a 31 y.o. female presenting with nausea, vomiting and diarrhea which started early this morning.  She denies fevers, chills, but does endorse intermittent upper abdominal cramping which improves after having a bm.  She denies hematemesis or bloody stools and denies any sick contacts, foreign travel or consumption of improperly cooked or expired foods. She also denies dysuria, vaginal discharge and pelvic or back pain. She has had no medicines prior to arrival.  The history is provided by the patient.  Diarrhea   Associated symptoms include abdominal pain and vomiting. Pertinent negatives include no chills, no headaches and no arthralgias.    Past Medical History:  Diagnosis Date  . Asthma   . Panic attacks anemia    There are no active problems to display for this patient.   History reviewed. No pertinent surgical history.  OB History    No data available       Home Medications    Prior to Admission medications   Medication Sig Start Date End Date Taking? Authorizing Provider  acetaminophen (TYLENOL) 500 MG tablet Take 1,000 mg by mouth once as needed for pain.    [provider]  carbamazepine (TEGRETOL XR) 200 MG 12 hr tablet Take 600 mg by mouth 2 (two) times daily.    [provider]  diphenoxylate-atropine (LOMOTIL) 2.5-0.025 MG tablet Take 1 tablet by mouth 4 (four) times daily as needed for diarrhea or loose stools. 02/06/17   Burgess AmorIdol, Neiko Trivedi, PA-C  Mirtazapine (REMERON PO) Take by mouth at bedtime.    [provider]  naproxen (NAPROSYN) 500 MG tablet Take 1 tablet (500 mg total) by mouth 2 (two) times daily with a meal. 07/12/14   Eber HongMiller, Brian, MD  promethazine (PHENERGAN) 25 MG tablet Take 1 tablet (25 mg total) by  mouth every 6 (six) hours as needed for nausea or vomiting. 02/06/17   Burgess AmorIdol, Kjell Brannen, PA-C  sertraline (ZOLOFT) 100 MG tablet Take 150 mg by mouth daily.     [provider]    Family History History reviewed. No pertinent family history.  Social History Social History   Tobacco Use  . Smoking status: Never Smoker  . Smokeless tobacco: Never Used  Substance Use Topics  . Alcohol use: No  . Drug use: Yes    Types: Marijuana    Comment: last used months ago     Allergies   Oseltamivir phosphate   Review of Systems Review of Systems  Constitutional: Negative for chills and fever.  HENT: Negative for congestion and sore throat.   Eyes: Negative.   Respiratory: Negative for chest tightness and shortness of breath.   Cardiovascular: Negative for chest pain.  Gastrointestinal: Positive for abdominal pain, diarrhea, nausea and vomiting.  Genitourinary: Negative.   Musculoskeletal: Negative for arthralgias, joint swelling and neck pain.  Skin: Negative.  Negative for rash and wound.  Neurological: Negative for dizziness, weakness, light-headedness, numbness and headaches.  Psychiatric/Behavioral: Negative.      Physical Exam Updated Vital Signs BP 118/67 (BP Location: Left Arm)   Pulse 71   Temp (!) 97.5 F (36.4 C) (Oral)   Resp 18   Ht 5\' 7"  (1.702 m)   Wt 99.8 kg (220 lb)   LMP 01/20/2017  SpO2 100%   BMI 34.46 kg/m   Physical Exam  Constitutional: She appears well-developed and well-nourished.  HENT:  Head: Normocephalic and atraumatic.  Eyes: Conjunctivae are normal.  Neck: Normal range of motion.  Cardiovascular: Normal rate, regular rhythm, normal heart sounds and intact distal pulses.  Pulmonary/Chest: Effort normal and breath sounds normal. She has no wheezes.  Abdominal: Soft. Bowel sounds are normal. She exhibits no distension. There is no tenderness. There is no guarding.  Musculoskeletal: Normal range of motion.  Neurological: She is alert.    Skin: Skin is warm and dry.  Psychiatric: She has a normal mood and affect.  Nursing note and vitals reviewed.    ED Treatments / Results  Labs (all labs ordered are listed, but only abnormal results are displayed) Labs Reviewed  COMPREHENSIVE METABOLIC PANEL - Abnormal; Notable for the following components:      Result Value   Chloride 100 (*)    Total Protein 8.6 (*)    All other components within normal limits  URINALYSIS, ROUTINE W REFLEX MICROSCOPIC - Abnormal; Notable for the following components:   Ketones, ur 20 (*)    All other components within normal limits  CBC WITH DIFFERENTIAL/PLATELET  LIPASE, BLOOD  PREGNANCY, URINE    EKG  EKG Interpretation None       Radiology No results found.  Procedures Procedures (including critical care time)  Medications Ordered in ED Medications  ondansetron (ZOFRAN) injection 4 mg (4 mg Intravenous Given 02/06/17 1355)  sodium chloride 0.9 % bolus 1,000 mL (1,000 mLs Intravenous New Bag/Given 02/06/17 1417)  diphenoxylate-atropine (LOMOTIL) 2.5-0.025 MG per tablet 2 tablet (2 tablets Oral Given 02/06/17 1641)     Initial Impression / Assessment and Plan / ED Course  I have reviewed the triage vital signs and the nursing notes.  Pertinent labs & imaging results that were available during my care of the patient were reviewed by me and considered in my medical decision making (see chart for details).     Patient with exam and history suggesting viral gastroenteritis.  Her labs are stable today except for mild ketones in her urine.  She was given IV fluids, also given Zofran and Lomotil here and she had no vomiting or diarrhea since arrival here.  She tolerated p.o. intake prior to discharge home.  Her repeat abdominal exam is nontender, nonacute and she states she feels improved.  She was prescribed Phenergan and Lomotil for home use as needed, discussed increase fluids, rest, brat diet.  Final Clinical Impressions(s) / ED  Diagnoses   Final diagnoses:  Gastroenteritis  Vomiting in pediatric patient    ED Discharge Orders        Ordered    ondansetron (ZOFRAN ODT) 4 MG disintegrating tablet  Every 8 hours PRN,   Status:  Discontinued     02/06/17 1646    diphenoxylate-atropine (LOMOTIL) 2.5-0.025 MG tablet  4 times daily PRN,   Status:  Discontinued     02/06/17 1646    diphenoxylate-atropine (LOMOTIL) 2.5-0.025 MG tablet  4 times daily PRN     02/06/17 1647    promethazine (PHENERGAN) 25 MG tablet  Every 6 hours PRN     02/06/17 1647       Burgess Amordol, Anibal Quinby, PA-C 02/06/17 1656    Bethann BerkshireZammit, Joseph, MD 02/07/17 1531

## 2017-02-06 NOTE — ED Notes (Signed)
Tolerated po fluid challenge well. 

## 2017-04-01 ENCOUNTER — Other Ambulatory Visit: Payer: Self-pay | Admitting: Obstetrics & Gynecology

## 2017-04-01 DIAGNOSIS — O3680X0 Pregnancy with inconclusive fetal viability, not applicable or unspecified: Secondary | ICD-10-CM

## 2017-04-03 ENCOUNTER — Ambulatory Visit (INDEPENDENT_AMBULATORY_CARE_PROVIDER_SITE_OTHER): Payer: Medicaid Other

## 2017-04-03 ENCOUNTER — Other Ambulatory Visit: Payer: Medicaid Other

## 2017-04-03 ENCOUNTER — Other Ambulatory Visit: Payer: Self-pay | Admitting: Obstetrics & Gynecology

## 2017-04-03 DIAGNOSIS — Z3490 Encounter for supervision of normal pregnancy, unspecified, unspecified trimester: Secondary | ICD-10-CM

## 2017-04-03 DIAGNOSIS — O3680X0 Pregnancy with inconclusive fetal viability, not applicable or unspecified: Secondary | ICD-10-CM

## 2017-04-03 DIAGNOSIS — Z3A01 Less than 8 weeks gestation of pregnancy: Secondary | ICD-10-CM

## 2017-04-03 NOTE — Progress Notes (Signed)
US 6+1 wks,single IUP w/ys,no fht visualized,irregular GS,normal ovaries bilat,Kim discussed results w/pt and ordered lab,f/u ultrasound in 10 days

## 2017-04-04 ENCOUNTER — Emergency Department (HOSPITAL_COMMUNITY)
Admission: EM | Admit: 2017-04-04 | Discharge: 2017-04-05 | Disposition: A | Discharge status: Home / Self Care | Payer: Medicaid Other | Attending: Emergency Medicine | Admitting: Emergency Medicine

## 2017-04-04 ENCOUNTER — Other Ambulatory Visit: Payer: Self-pay

## 2017-04-04 ENCOUNTER — Encounter (HOSPITAL_COMMUNITY): Payer: Self-pay | Admitting: *Deleted

## 2017-04-04 DIAGNOSIS — O26891 Other specified pregnancy related conditions, first trimester: Secondary | ICD-10-CM | POA: Diagnosis present

## 2017-04-04 DIAGNOSIS — O2341 Unspecified infection of urinary tract in pregnancy, first trimester: Secondary | ICD-10-CM | POA: Diagnosis not present

## 2017-04-04 DIAGNOSIS — O039 Complete or unspecified spontaneous abortion without complication: Secondary | ICD-10-CM | POA: Insufficient documentation

## 2017-04-04 DIAGNOSIS — O034 Incomplete spontaneous abortion without complication: Secondary | ICD-10-CM

## 2017-04-04 DIAGNOSIS — Z3A01 Less than 8 weeks gestation of pregnancy: Secondary | ICD-10-CM | POA: Diagnosis not present

## 2017-04-04 DIAGNOSIS — N390 Urinary tract infection, site not specified: Secondary | ICD-10-CM

## 2017-04-04 LAB — CBC WITH DIFFERENTIAL/PLATELET
BASOS ABS: 0 10*3/uL (ref 0.0–0.1)
Basophils Relative: 0 %
Eosinophils Absolute: 0.1 10*3/uL (ref 0.0–0.7)
Eosinophils Relative: 1 %
HEMATOCRIT: 37.2 % (ref 36.0–46.0)
Hemoglobin: 12.1 g/dL (ref 12.0–15.0)
LYMPHS PCT: 28 %
Lymphs Abs: 3 10*3/uL (ref 0.7–4.0)
MCH: 30 pg (ref 26.0–34.0)
MCHC: 32.5 g/dL (ref 30.0–36.0)
MCV: 92.3 fL (ref 78.0–100.0)
MONO ABS: 0.6 10*3/uL (ref 0.1–1.0)
MONOS PCT: 5 %
NEUTROS ABS: 6.9 10*3/uL (ref 1.7–7.7)
Neutrophils Relative %: 66 %
Platelets: 251 10*3/uL (ref 150–400)
RBC: 4.03 MIL/uL (ref 3.87–5.11)
RDW: 12.6 % (ref 11.5–15.5)
WBC: 10.6 10*3/uL — ABNORMAL HIGH (ref 4.0–10.5)

## 2017-04-04 LAB — WET PREP, GENITAL
Sperm: NONE SEEN
YEAST WET PREP: NONE SEEN

## 2017-04-04 LAB — BETA HCG QUANT (REF LAB): hCG Quant: 19560 m[IU]/mL

## 2017-04-04 NOTE — ED Provider Notes (Signed)
[facility] EMERGENCY DEPARTMENT Provider Note   CSN: [id number] Arrival date & time: 04/04/17  1932     History   Chief Complaint Chief Complaint  Patient presents with  . Abdominal Pain    HPI Kathy Robertson is a 32 y.o. female.  Patient G5P0 at [redacted] weeks gestation by dates presenting with vaginal bleeding and cramping that started yesterday.  States she has been spotting for several days [facility] and had a transvaginal ultrasound.  After the ultrasound she had increased cramping with more bleeding and clots.  States this feels like a menstrual cycle but stronger.  Denies any dizziness or lightheadedness.  No fever, chills, nausea.  She did vomit one time today.  No vaginal discharge.  She does not know the results of the ultrasound.  No other issues with her previous pregnancies.   The history is provided by the patient.  Abdominal Pain   Pertinent negatives include nausea, vomiting, dysuria, headaches, arthralgias and myalgias.    Past Medical History:  Diagnosis Date  . Asthma   . Panic attacks anemia    There are no active problems to display for this patient.   History reviewed. No pertinent surgical history.  OB History    Gravida Para Term Preterm AB Living   1             SAB TAB Ectopic Multiple Live Births                   Home Medications    Prior to Admission medications   Medication Sig Start Date End Date Taking? Authorizing Provider  acetaminophen (TYLENOL) 500 MG tablet Take 1,000 mg by mouth once as needed for pain.    [provider]  carbamazepine (TEGRETOL XR) 200 MG 12 hr tablet Take 600 mg by mouth 2 (two) times daily.    [provider]  diphenoxylate-atropine (LOMOTIL) 2.5-0.025 MG tablet Take 1 tablet by mouth 4 (four) times daily as needed for diarrhea or loose stools. 02/06/17   Burgess AmorIdol, Julie, PA-C  Mirtazapine (REMERON PO) Take by mouth at bedtime.    [provider]  naproxen  (NAPROSYN) 500 MG tablet Take 1 tablet (500 mg total) by mouth 2 (two) times daily with a meal. 07/12/14   Eber HongMiller, Brian, MD  promethazine (PHENERGAN) 25 MG tablet Take 1 tablet (25 mg total) by mouth every 6 (six) hours as needed for nausea or vomiting. 02/06/17   Burgess AmorIdol, Julie, PA-C  sertraline (ZOLOFT) 100 MG tablet Take 150 mg by mouth daily.     [provider]    Family History No family history on file.  Social History Social History   Tobacco Use  . Smoking status: Never Smoker  . Smokeless tobacco: Never Used  Substance Use Topics  . Alcohol use: No  . Drug use: Yes    Types: Marijuana     Allergies   Oseltamivir phosphate   Review of Systems Review of Systems  Constitutional: Negative for activity change and appetite change.  HENT: Negative for dental problem, hearing loss and rhinorrhea.   Respiratory: Negative for choking, chest tightness and shortness of breath.   Cardiovascular: Negative for chest pain.  Gastrointestinal: Positive for abdominal pain. Negative for nausea and vomiting.  Genitourinary: Positive for vaginal bleeding. Negative for dysuria, flank pain and pelvic pain.  Musculoskeletal: Negative for arthralgias and myalgias.  Skin: Negative for rash.  Neurological: Negative for dizziness, weakness and headaches.  all other systems are negative except as noted in the HPI and PMH.    Physical Exam Updated Vital Signs BP 122/84 (BP Location: Right Arm)   Pulse 92   Temp 98.1 F (36.7 C) (Oral)   Resp 15   Ht 5\' 7"  (1.702 m)   Wt 102.1 kg (225 lb)   LMP 01/25/2017 (Approximate)   SpO2 95%   BMI 35.24 kg/m   Physical Exam  Constitutional: She is oriented to person, place, and time. She appears well-developed and well-nourished. No distress.  HENT:  Head: Normocephalic and atraumatic.  Mouth/Throat: Oropharynx is clear and moist. No oropharyngeal exudate.  Eyes: Conjunctivae and EOM are normal. Pupils are equal, round, and reactive  to light.  Neck: Normal range of motion. Neck supple.  No meningismus.  Cardiovascular: Normal rate, regular rhythm, normal heart sounds and intact distal pulses.  No murmur heard. Pulmonary/Chest: Effort normal and breath sounds normal. No respiratory distress. She exhibits no tenderness.  Abdominal: Soft. There is tenderness. There is no rebound and no guarding.  Suprapubic tenderness  Genitourinary:  Genitourinary Comments: Chaperone present.  Normal external genitalia.  There is some dark blood in vaginal vault.  Cervix is closed.  There is no significant active bleeding.  No CMT.  There is midline uterine tenderness, no adnexal tenderness  Musculoskeletal: Normal range of motion. She exhibits no edema or tenderness.  Neurological: She is alert and oriented to person, place, and time. No cranial nerve deficit. She exhibits normal muscle tone. Coordination normal.  No ataxia on finger to nose bilaterally. No pronator drift. 5/5 strength throughout. CN 2-12 intact.Equal grip strength. Sensation intact.   Skin: Skin is warm.  Psychiatric: She has a normal mood and affect. Her behavior is normal.  Nursing note and vitals reviewed.    ED Treatments / Results  Labs (all labs ordered are listed, but only abnormal results are displayed) Labs Reviewed  WET PREP, GENITAL - Abnormal; Notable for the following components:      Result Value   WBC, Wet Prep HPF POC MANY (*)    All other components within normal limits  CBC WITH DIFFERENTIAL/PLATELET - Abnormal; Notable for the following components:   WBC 10.6 (*)    All other components within normal limits  HCG, QUANTITATIVE, PREGNANCY - Abnormal; Notable for the following components:   hCG, Beta Chain, Quant, S 20,331 (*)    All other components within normal limits  URINALYSIS, ROUTINE W REFLEX MICROSCOPIC - Abnormal; Notable for the following components:   Color, Urine AMBER (*)    Nitrite POSITIVE (*)    Leukocytes, UA SMALL (*)     Bacteria, UA RARE (*)    Squamous Epithelial / LPF 0-5 (*)    All other components within normal limits  URINE CULTURE  BASIC METABOLIC PANEL  ABO/RH  GC/CHLAMYDIA PROBE AMP ([place]) NOT AT [facility]    EKG  EKG Interpretation None       Radiology US Ob Comp Less 14 Wks  Result Date: 04/04/2017 DATING AND VIABILITY SONOGRAM Francyne TINISHA ETZKORN is a 32 y.o.. with LMP  01/25/2017  which would correlate to  9+[redacted] weeks gestation.  She has regular menstrual cycles.   She is here today for a confirmatory initial sonogram. GESTATION:SINGLETON   FETAL ACTIVITY:          Heart rate         No FHT         CERVIX: Appears closed ADNEXA: The  ovaries are normal. GESTATIONAL AGE AND  BIOMETRICS: Gestational criteria: Estimated Date of Delivery: 11/01/2016. by LMP now at 9+5 wks Previous Scans:0 GESTATIONAL SAC           16.6 mm         6+3 weeks CROWN RUMP LENGTH           5.30 mm         6+1 weeks                                                                      AVERAGE EGA(BY THIS SCAN):  6+1 weeks WORKING EDD( early ultrasound ):  11/25/2017  TECHNICIAN COMMENTS: [place] 6+1 wks,single IUP w/ys,no fht visualized,irregular GS,normal ovaries bilat,Kim discussed results w/pt and ordered lab,f/u ultrasound in 10 days A copy of this report including all images has been saved and backed up to a second source for retrieval if needed. All measures and details of the anatomical scan, placentation, fluid volume and pelvic anatomy are contained in that report. Amber Flora Lipps 04/03/2017 1:41 PM Clinical Impression and recommendations: I have reviewed the sonogram results above. Combined with the patient's current clinical course, below are my impressions and any appropriate recommendations for management based on the sonographic findings: 1.  Intrauterine gestational sac with yolk sac and fetal pole noted.  There is a discrepancy with the menstrual history, with crown-rump length less than anticipated, with fetal cardiac activity  absent.  Crown-rump length is 5.30 mm so with crown-rump length less than 7 mm this is not absolutely diagnostic of missed abortion but highly suspicious.  Additionally the gestational sac is showing irregular external contour, additionally suggestive of missed AB.  Will recommend follow-up ultrasound in 7-10 days for confirmation of suspected missed AB 2.  Normal adnexal structures,   US Ob Transvaginal  Result Date: 04/04/2017 DATING AND VIABILITY SONOGRAM CAMMY SANJURJO is a 32 y.o.. with LMP  01/25/2017  which would correlate to  9+[redacted] weeks gestation.  She has regular menstrual cycles.   She is here today for a confirmatory initial sonogram. GESTATION:SINGLETON   FETAL ACTIVITY:          Heart rate         No FHT         CERVIX: Appears closed ADNEXA: The ovaries are normal. GESTATIONAL AGE AND  BIOMETRICS: Gestational criteria: Estimated Date of Delivery: 11/01/2016. by LMP now at 9+5 wks Previous Scans:0 GESTATIONAL SAC           16.6 mm         6+3 weeks CROWN RUMP LENGTH           5.30 mm         6+1 weeks                                                                      AVERAGE EGA(BY THIS SCAN):  6+1 weeks WORKING EDD( early ultrasound ):  11/25/2017  TECHNICIAN COMMENTS: [place] 6+1 wks,single  IUP w/ys,no fht visualized,irregular GS,normal ovaries bilat,Kim discussed results w/pt and ordered lab,f/u ultrasound in 10 days A copy of this report including all images has been saved and backed up to a second source for retrieval if needed. All measures and details of the anatomical scan, placentation, fluid volume and pelvic anatomy are contained in that report. Amber Flora Lipps 04/03/2017 1:41 PM Clinical Impression and recommendations: I have reviewed the sonogram results above. Combined with the patient's current clinical course, below are my impressions and any appropriate recommendations for management based on the sonographic findings: 1.  Intrauterine gestational sac with yolk sac and fetal pole noted.   There is a discrepancy with the menstrual history, with crown-rump length less than anticipated, with fetal cardiac activity absent.  Crown-rump length is 5.30 mm so with crown-rump length less than 7 mm this is not absolutely diagnostic of missed abortion but highly suspicious.  Additionally the gestational sac is showing irregular external contour, additionally suggestive of missed AB.  Will recommend follow-up ultrasound in 7-10 days for confirmation of suspected missed AB 2.  Normal adnexal structures,    Procedures Procedures (including critical care time)  Medications Ordered in ED Medications - No data to display   Initial Impression / Assessment and Plan / ED Course  I have reviewed the triage vital signs and the nursing notes.  Pertinent labs & imaging results that were available during my care of the patient were reviewed by me and considered in my medical decision making (see chart for details).    Patient G5P4 at [redacted] weeks gestation presenting with pelvic pain and vaginal bleeding.  She is hemodynamically stable with a soft abdomen.  Ultrasound from January 31 showed intrauterine gestational sac with yolk sac and fetal pole.  Fetal cardiac activity was absent.  Ultrasound was concerning for missed abortion.  Patient hemodynamically stable in the ED.  Hemoglobin is stable.  Orthostatics show no drop in blood pressure with standing.  She is given IV fluids in the ED.  Discussed with patient that based on her presentation and ultrasound results she is probably having a miscarriage. There is a slight possibility that this still may be a normal pregnancy.  Her Rh is positive, she is not a RhoGam candidate. No evidence of ectopic pregnancy and ultrasound.  Patient to follow-up with OB for repeat ultrasound and hCG next week.  Will culture urine and treat empirically.  Return precautions discussed including worsening pain, bleeding, dizziness, or any other concerns.  BP 120/74 (BP  Location: Left Arm)   Pulse 92   Temp 98 F (36.7 C) (Oral)   Resp 16   Ht 5\' 7"  (1.702 m)   Wt 102.1 kg (225 lb)   LMP 01/25/2017 (Approximate)   SpO2 98%   BMI 35.24 kg/m    Final Clinical Impressions(s) / ED Diagnoses   Final diagnoses:  Inevitable abortion  Urinary tract infection without hematuria, site unspecified    ED Discharge Orders        Ordered    cephALEXin (KEFLEX) 500 MG capsule  4 times daily     04/05/17 0150       Glynn Octave, MD 04/05/17 [phone]

## 2017-04-04 NOTE — ED Notes (Addendum)
Pt has had spotting for several days. Pt states she went to her doctor yesterday and had a transvaginal ultrasound done. Pt states during and after the ultrasound she had cramping and pain with continuation of blood and clott discharge that continuted and has worsened. Pt states she had her lab work drawn and will not know the results until 10 days.

## 2017-04-04 NOTE — ED Triage Notes (Signed)
Pt reports being [redacted] weeks pregnant and having abdominal cramping and heavy bleeding with clots. Pt saw her ob/gyn and was not having these symptoms yesterday.

## 2017-04-05 LAB — BASIC METABOLIC PANEL
ANION GAP: 9 (ref 5–15)
BUN: 6 mg/dL (ref 6–20)
CO2: 25 mmol/L (ref 22–32)
Calcium: 9.2 mg/dL (ref 8.9–10.3)
Chloride: 105 mmol/L (ref 101–111)
Creatinine, Ser: 0.73 mg/dL (ref 0.44–1.00)
GFR calc Af Amer: >60 mL/min (ref >60)
GFR calc non Af Amer: >60 mL/min (ref >60)
GLUCOSE: 91 mg/dL (ref 65–99)
Potassium: 3.7 mmol/L (ref 3.5–5.1)
Sodium: 139 mmol/L (ref 135–145)

## 2017-04-05 LAB — URINALYSIS, ROUTINE W REFLEX MICROSCOPIC
BILIRUBIN URINE: NEGATIVE
GLUCOSE, UA: NEGATIVE mg/dL
HGB URINE DIPSTICK: NEGATIVE
KETONES UR: NEGATIVE mg/dL
NITRITE: POSITIVE — AB
PROTEIN: NEGATIVE mg/dL
Specific Gravity, Urine: 1.018 (ref 1.005–1.030)
pH: 7 (ref 5.0–8.0)

## 2017-04-05 LAB — HCG, QUANTITATIVE, PREGNANCY: hCG, Beta Chain, Quant, S: 20331 m[IU]/mL — ABNORMAL HIGH (ref <5)

## 2017-04-05 LAB — ABO/RH: ABO/RH(D): B POS

## 2017-04-05 MED ORDER — SODIUM CHLORIDE 0.9 % IV BOLUS (SEPSIS)
1000.0000 mL | Freq: Once | INTRAVENOUS | Status: AC
  Administered 2017-04-05: 1000 mL via INTRAVENOUS

## 2017-04-05 MED ORDER — CEPHALEXIN 500 MG PO CAPS: 500.0000 mg | ORAL_CAPSULE | Freq: Four times a day (QID) | ORAL | 0 refills | Status: DC

## 2017-04-05 NOTE — Discharge Instructions (Signed)
As we discussed, you are probably having a miscarriage.  [facility] and blood test next week.  Return to the ED with worsening pain, bleeding, dizziness or any other concerns.

## 2017-04-07 LAB — URINE CULTURE: Culture: >=100000 — AB

## 2017-04-07 LAB — GC/CHLAMYDIA PROBE AMP (~~LOC~~) NOT AT ARMC
CHLAMYDIA, DNA PROBE: POSITIVE — AB
Neisseria Gonorrhea: NEGATIVE

## 2017-04-08 ENCOUNTER — Telehealth: Payer: Self-pay | Admitting: Emergency Medicine

## 2017-04-08 NOTE — Telephone Encounter (Signed)
Post ED Visit - Positive Culture Follow-up  Culture report reviewed by antimicrobial stewardship pharmacist:  []  Kathy Robertson, Pharm.D. []  Kathy Robertson, Pharm.D., BCPS AQ-ID [x]  Kathy Robertson, Pharm.D., BCPS []  Kathy Robertson, Pharm.D., BCPS []  Kathy Robertson, [address] BoulevardPharm.D., BCPS, AAHIVP []  Kathy Robertson, Pharm.D., BCPS, AAHIVP []  Kathy Robertson, PharmD, BCPS []  Kathy Robertson, PharmD []  Kathy Robertson, PharmD, BCPS  Positive urine culture Treated with cephalexin, organism sensitive to the same and no further patient follow-up is required at this time.  Kathy Robertson, Kathy Robertson 04/08/2017, 10:21 AM

## 2017-04-10 ENCOUNTER — Telehealth: Payer: Self-pay | Admitting: Student

## 2017-04-10 DIAGNOSIS — A749 Chlamydial infection, unspecified: Secondary | ICD-10-CM

## 2017-04-10 MED ORDER — AZITHROMYCIN 500 MG PO TABS: 1000.0000 mg | ORAL_TABLET | Freq: Once | ORAL | 0 refills | Status: AC

## 2017-04-10 NOTE — Telephone Encounter (Addendum)
Kathy Robertson tested positive for  Chlamydia. Patient was called by RN and allergies and pharmacy confirmed. Rx sent to pharmacy of choice.   Kathy Robertson, Kathy Eichler, NP 04/10/2017 11:36 AM       ----- Message from Kathy BectonLori Robertson Berdik, RN sent at 04/09/2017 10:52 AM EST ----- This patient tested positive for :"Chlamydia"  She "is allergic to," Tamiful" , I have informed the patient of her results and confirmed her pharmacy is correct in her chart. Please send Rx.   Thank you,   Kathy Robertson, Kathy S, RN   [facility]

## 2017-04-11 ENCOUNTER — Other Ambulatory Visit: Payer: Self-pay | Admitting: Women's Health

## 2017-04-11 DIAGNOSIS — O3680X0 Pregnancy with inconclusive fetal viability, not applicable or unspecified: Secondary | ICD-10-CM

## 2017-04-14 ENCOUNTER — Other Ambulatory Visit: Payer: Medicaid Other

## 2017-04-14 ENCOUNTER — Ambulatory Visit (INDEPENDENT_AMBULATORY_CARE_PROVIDER_SITE_OTHER): Payer: Medicaid Other | Admitting: Women's Health

## 2017-04-14 ENCOUNTER — Other Ambulatory Visit: Payer: Self-pay | Admitting: Women's Health

## 2017-04-14 ENCOUNTER — Encounter: Payer: Self-pay | Admitting: Women's Health

## 2017-04-14 ENCOUNTER — Ambulatory Visit (INDEPENDENT_AMBULATORY_CARE_PROVIDER_SITE_OTHER): Payer: Medicaid Other

## 2017-04-14 VITALS — BP 118/80 | HR 93 | Ht 67.0 in | Wt 232.0 lb

## 2017-04-14 DIAGNOSIS — Z3A01 Less than 8 weeks gestation of pregnancy: Secondary | ICD-10-CM | POA: Diagnosis not present

## 2017-04-14 DIAGNOSIS — O3680X0 Pregnancy with inconclusive fetal viability, not applicable or unspecified: Secondary | ICD-10-CM | POA: Diagnosis not present

## 2017-04-14 DIAGNOSIS — O039 Complete or unspecified spontaneous abortion without complication: Secondary | ICD-10-CM | POA: Diagnosis not present

## 2017-04-14 NOTE — Progress Notes (Signed)
   GYN VISIT Patient name: Kathy Robertson MRN [id number]  Date of birth: 05/30/1985 Chief Complaint:   Follow-up (ultrasound)  History of Present Illness:   Kathy CoryShajuana M Bia is a 32 y.o.  African American female at 35108w5d by 6wk u/s, being seen today for f/u after viability u/s. She had dating u/s on 04/03/17 at which time CRL c/w 6657w1d, no FCA visualized, GS irregular in shape and c/w 2764w3d. She had HCG drawn that day, was 19,560. She went to [facility] the next day with complaint of abd pain and vb, her HCG was 20,331. She was given keflex rx for UTI, she also tested positive for chlamydia and was treated on 04/10/17. Today denies abd pain or vb.   Patient's last menstrual period was 01/25/2017 (approximate). Review of Systems:   Pertinent items are noted in HPI Denies fever/chills, dizziness, headaches, visual disturbances, fatigue, shortness of breath, chest pain, abdominal pain, vomiting, abnormal vaginal discharge/itching/odor/irritation, problems with periods, bowel movements, urination, or intercourse unless otherwise stated above.  Pertinent History Reviewed:  Reviewed past medical,surgical, social, obstetrical and family history.  Reviewed problem list, medications and allergies. Physical Assessment:   Vitals:   04/14/17 0927  BP: 118/80  Pulse: 93  Weight: 232 lb (105.2 kg)  Height: 5\' 7"  (1.702 m)  Body mass index is 36.34 kg/m.       Physical Examination:   General appearance: alert, well appearing, and in no distress  Mental status: alert, oriented to person, place, and time  Skin: warm & dry   Cardiovascular: normal heart rate noted  Respiratory: normal respiratory effort, no distress  Abdomen: soft, non-tender   Pelvic: examination not indicated  Extremities: no edema   Today's f/u viability u/s:  US TV:no IUP visualized,complex endometrium w/ color flow (fundal) EEC 16 mm,normal ovaries bilat,no free fluid,Kim discussed results w/pt and ordered labs.  No results found for  this or any previous visit (from the past 24 hour(s)).  Assessment & Plan:  1) Completed SAB> discussed w/ pt after u/s, will get BHCG today and repeat weekly until <5. Blood type B+, Rhogam not indicated. Pt not sure if she wants another pregnancy, if decides she does not, she is to make appt w/ us to discuss contraception. If she does decide for another pregnancy, make sure taking pnv.   2) Recent +CT> needs poc 3-4wks after tx (will be around 2/28-3/7)  Meds: No orders of the defined types were placed in this encounter.   No orders of the defined types were placed in this encounter.   Return for 3/4 for GYN visit., CT poc  Cheral MarkerKimberly R Antwain Caliendo CNM, [facility] 04/14/2017 11:30 AM

## 2017-04-14 NOTE — Progress Notes (Addendum)
US TV:no IUP visualized,complex endometrium w/ color flow (fundal) EEC 16 mm,normal ovaries bilat,no free fluid,Kim discussed results w/pt and ordered labs.

## 2017-04-15 LAB — BETA HCG QUANT (REF LAB): HCG QUANT: 5395 m[IU]/mL

## 2017-04-21 ENCOUNTER — Other Ambulatory Visit: Payer: Medicaid Other

## 2017-04-23 ENCOUNTER — Other Ambulatory Visit: Payer: Medicaid Other

## 2017-04-23 DIAGNOSIS — O039 Complete or unspecified spontaneous abortion without complication: Secondary | ICD-10-CM

## 2017-04-24 LAB — GC/CHLAMYDIA PROBE AMP
CHLAMYDIA, DNA PROBE: NEGATIVE
NEISSERIA GONORRHOEAE BY PCR: NEGATIVE

## 2017-04-24 LAB — BETA HCG QUANT (REF LAB): hCG Quant: 71 m[IU]/mL

## 2017-04-25 ENCOUNTER — Encounter: Payer: Self-pay | Admitting: Women's Health

## 2017-04-25 DIAGNOSIS — A749 Chlamydial infection, unspecified: Secondary | ICD-10-CM | POA: Insufficient documentation

## 2017-05-05 ENCOUNTER — Ambulatory Visit: Payer: Medicaid Other | Admitting: Women's Health

## 2017-05-08 ENCOUNTER — Encounter: Payer: Self-pay | Admitting: Women's Health

## 2017-05-08 ENCOUNTER — Ambulatory Visit: Payer: Medicaid Other | Admitting: Women's Health

## 2017-05-08 VITALS — BP 90/60 | HR 96 | Ht 67.0 in | Wt 222.0 lb

## 2017-05-08 DIAGNOSIS — Z3009 Encounter for other general counseling and advice on contraception: Secondary | ICD-10-CM | POA: Diagnosis not present

## 2017-05-08 DIAGNOSIS — O039 Complete or unspecified spontaneous abortion without complication: Secondary | ICD-10-CM | POA: Diagnosis not present

## 2017-05-08 NOTE — Patient Instructions (Signed)
NO SEX UNTIL AFTER YOU GET YOUR BIRTH CONTROL  

## 2017-05-08 NOTE — Progress Notes (Signed)
   GYN VISIT Patient name: Kathy Robertson MRN [id number]  Date of birth: 10/20/1985 Chief Complaint:   Follow-up (miscarriage)  History of Present Illness:   Kathy Robertson is a 32 y.o. [id number] African American female being seen today for f/u on miscarriage.  She was seen 2/11 for spontaneous Ab, last BHCG 2wks ago 71.  BHCG's 19,560>20,331>5,395>71, last drawn 2wks ago. Was dx w/ +CT in ED 04/04/17, POC neg 2/22. Does not desire another pregnancy, but not ready for anything permanent. Had Mirena before, but it expelled itself. Doesn't want depo/pills/patch/ring. Discussed IUD vs. Nexplanon. Pt wants to try another IUD.  Last sex 3/4, used condom.   No LMP recorded. The current method of family planning is condoms  Last pap uncertain. Results were:  uncertain Review of Systems:   Pertinent items are noted in HPI Denies fever/chills, dizziness, headaches, visual disturbances, fatigue, shortness of breath, chest pain, abdominal pain, vomiting, abnormal vaginal discharge/itching/odor/irritation, problems with periods, bowel movements, urination, or intercourse unless otherwise stated above.  Pertinent History Reviewed:  Reviewed past medical,surgical, social, obstetrical and family history.  Reviewed problem list, medications and allergies. Physical Assessment:   Vitals:   05/08/17 1117  BP: 90/60  Pulse: 96  Weight: 222 lb (100.7 kg)  Height: 5\' 7"  (1.702 m)  Body mass index is 34.77 kg/m.       Physical Examination:   General appearance: alert, well appearing, and in no distress  Mental status: alert, oriented to person, place, and time  Skin: warm & dry   Cardiovascular: normal heart rate noted  Respiratory: normal respiratory effort, no distress  Abdomen: soft, non-tender   Pelvic: examination not indicated  Extremities: no edema   No results found for this or any previous visit (from the past 24 hour(s)).  Assessment & Plan:  1) F/U SAB  2) Contraception counseling> wants  IUD, abstinence until after insertion. Will get BHCG am of 3/20 then insertion pm. Will also need pap, try to get schedule before IUD.  Meds: No orders of the defined types were placed in this encounter.   Orders Placed This Encounter  Procedures  . Beta hCG quant (ref lab)    Return in about 7 days (around 05/15/2017) for bhcg AM, IUD pm.  Cheral MarkerKimberly R Alann Avey CNM, [facility]NP-BC 05/08/2017 1:52 PM

## 2017-05-16 ENCOUNTER — Other Ambulatory Visit: Payer: Medicaid Other | Admitting: Women's Health

## 2017-05-21 ENCOUNTER — Other Ambulatory Visit: Payer: Medicaid Other

## 2017-05-21 ENCOUNTER — Ambulatory Visit: Payer: Medicaid Other | Admitting: Women's Health

## 2017-08-09 ENCOUNTER — Encounter (HOSPITAL_COMMUNITY): Payer: Self-pay

## 2017-08-09 ENCOUNTER — Other Ambulatory Visit: Payer: Self-pay

## 2017-08-09 ENCOUNTER — Emergency Department (HOSPITAL_COMMUNITY)
Admission: EM | Admit: 2017-08-09 | Discharge: 2017-08-09 | Disposition: A | Discharge status: Home / Self Care | Payer: Medicaid Other | Attending: Emergency Medicine | Admitting: Emergency Medicine

## 2017-08-09 DIAGNOSIS — R112 Nausea with vomiting, unspecified: Secondary | ICD-10-CM | POA: Diagnosis not present

## 2017-08-09 DIAGNOSIS — R197 Diarrhea, unspecified: Secondary | ICD-10-CM | POA: Diagnosis not present

## 2017-08-09 DIAGNOSIS — R1084 Generalized abdominal pain: Secondary | ICD-10-CM | POA: Insufficient documentation

## 2017-08-09 DIAGNOSIS — Z79899 Other long term (current) drug therapy: Secondary | ICD-10-CM | POA: Insufficient documentation

## 2017-08-09 DIAGNOSIS — J45909 Unspecified asthma, uncomplicated: Secondary | ICD-10-CM | POA: Insufficient documentation

## 2017-08-09 LAB — URINALYSIS, ROUTINE W REFLEX MICROSCOPIC
Bacteria, UA: NONE SEEN
Bilirubin Urine: NEGATIVE
Glucose, UA: NEGATIVE mg/dL
Hgb urine dipstick: NEGATIVE
KETONES UR: 5 mg/dL — AB
Leukocytes, UA: NEGATIVE
Nitrite: NEGATIVE
PROTEIN: 30 mg/dL — AB
Specific Gravity, Urine: 1.023 (ref 1.005–1.030)
pH: 9 — ABNORMAL HIGH (ref 5.0–8.0)

## 2017-08-09 LAB — CBC WITH DIFFERENTIAL/PLATELET
BASOS ABS: 0 10*3/uL (ref 0.0–0.1)
BASOS PCT: 0 %
Eosinophils Absolute: 0 10*3/uL (ref 0.0–0.7)
Eosinophils Relative: 0 %
HEMATOCRIT: 42.2 % (ref 36.0–46.0)
Hemoglobin: 14 g/dL (ref 12.0–15.0)
Lymphocytes Relative: 14 %
Lymphs Abs: 1 10*3/uL (ref 0.7–4.0)
MCH: 30.7 pg (ref 26.0–34.0)
MCHC: 33.2 g/dL (ref 30.0–36.0)
MCV: 92.5 fL (ref 78.0–100.0)
MONO ABS: 0.2 10*3/uL (ref 0.1–1.0)
Monocytes Relative: 3 %
NEUTROS ABS: 6.1 10*3/uL (ref 1.7–7.7)
NEUTROS PCT: 83 %
Platelets: 271 10*3/uL (ref 150–400)
RBC: 4.56 MIL/uL (ref 3.87–5.11)
RDW: 12.9 % (ref 11.5–15.5)
WBC: 7.3 10*3/uL (ref 4.0–10.5)

## 2017-08-09 LAB — COMPREHENSIVE METABOLIC PANEL
ALBUMIN: 4.4 g/dL (ref 3.5–5.0)
ALT: 17 U/L (ref 14–54)
AST: 20 U/L (ref 15–41)
Alkaline Phosphatase: 45 U/L (ref 38–126)
Anion gap: 11 (ref 5–15)
BILIRUBIN TOTAL: 0.5 mg/dL (ref 0.3–1.2)
BUN: 10 mg/dL (ref 6–20)
CHLORIDE: 104 mmol/L (ref 101–111)
CO2: 26 mmol/L (ref 22–32)
Calcium: 9.7 mg/dL (ref 8.9–10.3)
Creatinine, Ser: 0.9 mg/dL (ref 0.44–1.00)
GFR calc Af Amer: >60 mL/min (ref >60)
GFR calc non Af Amer: >60 mL/min (ref >60)
GLUCOSE: 122 mg/dL — AB (ref 65–99)
POTASSIUM: 3.9 mmol/L (ref 3.5–5.1)
Sodium: 141 mmol/L (ref 135–145)
TOTAL PROTEIN: 8.7 g/dL — AB (ref 6.5–8.1)

## 2017-08-09 LAB — PREGNANCY, URINE: PREG TEST UR: NEGATIVE

## 2017-08-09 MED ORDER — ONDANSETRON 4 MG PO TBDP: 4.0000 mg | ORAL_TABLET | Freq: Three times a day (TID) | ORAL | 0 refills | Status: DC | PRN

## 2017-08-09 MED ORDER — ONDANSETRON HCL 4 MG/2ML IJ SOLN
4.0000 mg | Freq: Once | INTRAMUSCULAR | Status: AC
Start: 2017-08-09 — End: 2017-08-09
  Administered 2017-08-09: 4 mg via INTRAVENOUS
  Filled 2017-08-09: qty 2

## 2017-08-09 MED ORDER — SODIUM CHLORIDE 0.9 % IV BOLUS
1000.0000 mL | Freq: Once | INTRAVENOUS | Status: AC
  Administered 2017-08-09: 1000 mL via INTRAVENOUS

## 2017-08-09 NOTE — ED Provider Notes (Signed)
[facility] EMERGENCY DEPARTMENT Provider Note   CSN: [id number] Arrival date & time: 08/09/17  0753     History   Chief Complaint Chief Complaint  Patient presents with  . Abdominal Pain    HPI Kathy Robertson is a 32 y.o. female.  The history is provided by the patient. No language interpreter was used.  Abdominal Pain   This is a new problem. The current episode started 12 to 24 hours ago. The problem occurs constantly. The problem has not changed since onset.The pain is associated with eating. The pain is located in the generalized abdominal region. The pain is mild. Associated symptoms include nausea and vomiting. Nothing aggravates the symptoms. Nothing relieves the symptoms. Past workup does not include CT scan. Her past medical history does not include PUD or ulcerative colitis.    Past Medical History:  Diagnosis Date  . Asthma   . Panic attacks anemia    Patient Active Problem List   Diagnosis Date Noted  . Chlamydia 04/25/2017  . Miscarriage 04/14/2017    History reviewed. No pertinent surgical history.   OB History    Gravida  5   Para  4   Term  4   Preterm      AB  1   Living  4     SAB  1   TAB      Ectopic      Multiple      Live Births  4            Home Medications    Prior to Admission medications   Medication Sig Start Date End Date Taking? Authorizing Provider  citalopram (CELEXA) 20 MG tablet Take 1 tablet by mouth daily. 07/29/17  Yes [provider]  traZODone (DESYREL) 100 MG tablet Take 1 tablet by mouth at bedtime as needed. 07/29/17  Yes [provider]  acetaminophen (TYLENOL) 500 MG tablet Take 1,000 mg by mouth once as needed for pain.    [provider]  carbamazepine (TEGRETOL XR) 200 MG 12 hr tablet Take 600 mg by mouth 2 (two) times daily.    [provider]  Mirtazapine (REMERON PO) Take by mouth at bedtime.    [provider]  ondansetron (ZOFRAN ODT) 4 MG  disintegrating tablet Take 1 tablet (4 mg total) by mouth every 8 (eight) hours as needed for nausea or vomiting. 08/09/17   Elson Areas, PA-C  sertraline (ZOLOFT) 100 MG tablet Take 150 mg by mouth daily.     [provider]    Family History Family History  Problem Relation Age of Onset  . Diabetes Father   . Hypertension Father   . Heart attack Father   . Diabetes Mother   . Hypertension Mother   . Heart failure Mother   . Bell's palsy Sister     Social History Social History   Tobacco Use  . Smoking status: Never Smoker  . Smokeless tobacco: Never Used  Substance Use Topics  . Alcohol use: No  . Drug use: Yes    Types: Marijuana     Allergies   Oseltamivir phosphate   Review of Systems Review of Systems  Gastrointestinal: Positive for abdominal pain, nausea and vomiting.  All other systems reviewed and are negative.    Physical Exam Updated Vital Signs BP 110/71 (BP Location: Left Arm)   Pulse 91   Temp 97.6 F (36.4 C) (Oral)   Resp 15   Ht 5'  7" (1.702 m)   Wt 97.1 kg (214 lb)   LMP 08/07/2017   SpO2 100%   BMI 33.52 kg/m   Physical Exam  Constitutional: She is oriented to person, place, and time. She appears well-developed and well-nourished.  HENT:  Head: Normocephalic.  Mouth/Throat: Oropharynx is clear and moist.  Eyes: EOM are normal.  Neck: Normal range of motion.  Cardiovascular: Normal rate, regular rhythm and normal heart sounds.  Pulmonary/Chest: Effort normal.  Abdominal: Soft. Normal appearance and bowel sounds are normal. She exhibits no distension. There is no rebound and no CVA tenderness.  Musculoskeletal: Normal range of motion.  Neurological: She is alert and oriented to person, place, and time.  Skin: Skin is warm.  Psychiatric: She has a normal mood and affect.  Nursing note and vitals reviewed.    ED Treatments / Results  Labs (all labs ordered are listed, but only abnormal results are displayed) Labs  Reviewed  COMPREHENSIVE METABOLIC PANEL - Abnormal; Notable for the following components:      Result Value   Glucose, Bld 122 (*)    Total Protein 8.7 (*)    All other components within normal limits  URINALYSIS, ROUTINE W REFLEX MICROSCOPIC - Abnormal; Notable for the following components:   APPearance HAZY (*)    pH 9.0 (*)    Ketones, ur 5 (*)    Protein, ur 30 (*)    All other components within normal limits  CBC WITH DIFFERENTIAL/PLATELET  PREGNANCY, URINE    EKG None  Radiology No results found.  Procedures Procedures (including critical care time)  Medications Ordered in ED Medications  sodium chloride 0.9 % bolus 1,000 mL (0 mLs Intravenous Stopped 08/09/17 1243)  ondansetron (ZOFRAN) injection 4 mg (4 mg Intravenous Given 08/09/17 1004)     Initial Impression / Assessment and Plan / ED Course  I have reviewed the triage vital signs and the nursing notes.  Pertinent labs & imaging results that were available during my care of the patient were reviewed by me and considered in my medical decision making (see chart for details).     MDM  Labs reviewed,  Pt given Iv fluids and zofran.  Pt reports feeling much better.  Pt able to tolerate po fluids without difficulty.   Final Clinical Impressions(s) / ED Diagnoses   Final diagnoses:  Generalized abdominal pain  Nausea vomiting and diarrhea    ED Discharge Orders        Ordered    ondansetron (ZOFRAN ODT) 4 MG disintegrating tablet  Every 8 hours PRN     08/09/17 1233    An After Visit Summary was printed and given to the patient.    Elson AreasSofia, Dajane Valli K, PA-C 08/09/17 1641    Donnetta Hutchingook, Brian, MD 08/10/17 [phone]

## 2017-08-09 NOTE — ED Triage Notes (Signed)
Pt reports abd cramping, n/v/d and fever since 2 am.

## 2017-09-26 ENCOUNTER — Emergency Department (HOSPITAL_COMMUNITY)
Admission: EM | Admit: 2017-09-26 | Discharge: 2017-09-26 | Disposition: A | Discharge status: Home / Self Care | Payer: Medicaid Other | Attending: Emergency Medicine | Admitting: Emergency Medicine

## 2017-09-26 ENCOUNTER — Encounter (HOSPITAL_COMMUNITY): Payer: Self-pay | Admitting: Emergency Medicine

## 2017-09-26 ENCOUNTER — Emergency Department (HOSPITAL_COMMUNITY): Payer: Medicaid Other

## 2017-09-26 ENCOUNTER — Other Ambulatory Visit: Payer: Self-pay

## 2017-09-26 DIAGNOSIS — M545 Low back pain: Secondary | ICD-10-CM | POA: Diagnosis not present

## 2017-09-26 DIAGNOSIS — F17200 Nicotine dependence, unspecified, uncomplicated: Secondary | ICD-10-CM | POA: Diagnosis not present

## 2017-09-26 DIAGNOSIS — R51 Headache: Secondary | ICD-10-CM | POA: Insufficient documentation

## 2017-09-26 DIAGNOSIS — Y929 Unspecified place or not applicable: Secondary | ICD-10-CM | POA: Diagnosis not present

## 2017-09-26 DIAGNOSIS — Y939 Activity, unspecified: Secondary | ICD-10-CM | POA: Diagnosis not present

## 2017-09-26 DIAGNOSIS — Y999 Unspecified external cause status: Secondary | ICD-10-CM | POA: Insufficient documentation

## 2017-09-26 DIAGNOSIS — Z79899 Other long term (current) drug therapy: Secondary | ICD-10-CM | POA: Insufficient documentation

## 2017-09-26 DIAGNOSIS — M549 Dorsalgia, unspecified: Secondary | ICD-10-CM | POA: Diagnosis present

## 2017-09-26 LAB — POC URINE PREG, ED: Preg Test, Ur: NEGATIVE

## 2017-09-26 MED ORDER — OXYCODONE-ACETAMINOPHEN 5-325 MG PO TABS
1.0000 | ORAL_TABLET | Freq: Once | ORAL | Status: AC
  Administered 2017-09-26: 1 via ORAL
  Filled 2017-09-26: qty 1

## 2017-09-26 MED ORDER — METHOCARBAMOL 500 MG PO TABS
500.0000 mg | ORAL_TABLET | Freq: Once | ORAL | Status: AC
  Administered 2017-09-26: 500 mg via ORAL
  Filled 2017-09-26: qty 1

## 2017-09-26 MED ORDER — KETOROLAC TROMETHAMINE 60 MG/2ML IM SOLN
60.0000 mg | Freq: Once | INTRAMUSCULAR | Status: AC
  Administered 2017-09-26: 60 mg via INTRAMUSCULAR
  Filled 2017-09-26: qty 2

## 2017-09-26 MED ORDER — METHOCARBAMOL 500 MG PO TABS: 500.0000 mg | ORAL_TABLET | Freq: Two times a day (BID) | ORAL | 0 refills | Status: DC

## 2017-09-26 MED ORDER — IBUPROFEN 400 MG PO TABS: 400.0000 mg | ORAL_TABLET | Freq: Four times a day (QID) | ORAL | 0 refills | Status: DC | PRN
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
## 2017-09-26 NOTE — ED Notes (Signed)
Patient returned from CT

## 2017-09-26 NOTE — ED Notes (Signed)
Patient transported to xray and CT. 

## 2017-11-26 ENCOUNTER — Ambulatory Visit: Payer: Medicaid Other | Attending: Podiatry | Admitting: Physical Therapy

## 2017-11-26 ENCOUNTER — Telehealth: Payer: Self-pay | Admitting: Physical Therapy

## 2017-11-26 NOTE — Telephone Encounter (Signed)
Pt no show for PT appointment today. They where contacted and informed of this. They where given number to call front office to reschedule  Ivery Quale, PT, DPT 11/26/17 11:08 AM

## 2018-08-09 ENCOUNTER — Other Ambulatory Visit: Payer: Self-pay

## 2018-08-09 ENCOUNTER — Encounter (HOSPITAL_COMMUNITY): Payer: Self-pay | Admitting: Emergency Medicine

## 2018-08-09 ENCOUNTER — Emergency Department (HOSPITAL_COMMUNITY)
Admission: EM | Admit: 2018-08-09 | Discharge: 2018-08-10 | Disposition: A | Discharge status: Home / Self Care | Payer: No Typology Code available for payment source | Attending: Emergency Medicine | Admitting: Emergency Medicine

## 2018-08-09 DIAGNOSIS — Z87891 Personal history of nicotine dependence: Secondary | ICD-10-CM | POA: Diagnosis not present

## 2018-08-09 DIAGNOSIS — Y9389 Activity, other specified: Secondary | ICD-10-CM | POA: Diagnosis not present

## 2018-08-09 DIAGNOSIS — Y999 Unspecified external cause status: Secondary | ICD-10-CM | POA: Insufficient documentation

## 2018-08-09 DIAGNOSIS — S3992XA Unspecified injury of lower back, initial encounter: Secondary | ICD-10-CM | POA: Diagnosis present

## 2018-08-09 DIAGNOSIS — Y92410 Unspecified street and highway as the place of occurrence of the external cause: Secondary | ICD-10-CM | POA: Diagnosis not present

## 2018-08-09 DIAGNOSIS — S39012A Strain of muscle, fascia and tendon of lower back, initial encounter: Secondary | ICD-10-CM | POA: Insufficient documentation

## 2018-08-09 DIAGNOSIS — J45909 Unspecified asthma, uncomplicated: Secondary | ICD-10-CM | POA: Insufficient documentation

## 2018-08-09 DIAGNOSIS — Z79899 Other long term (current) drug therapy: Secondary | ICD-10-CM | POA: Diagnosis not present

## 2018-08-09 NOTE — ED Triage Notes (Signed)
Pt c/o lower back pain and right leg "twitching"

## 2018-08-10 ENCOUNTER — Emergency Department (HOSPITAL_COMMUNITY): Payer: No Typology Code available for payment source

## 2018-08-10 LAB — POC URINE PREG, ED: Preg Test, Ur: NEGATIVE

## 2018-08-10 MED ORDER — METHOCARBAMOL 500 MG PO TABS: 500.0000 mg | ORAL_TABLET | Freq: Two times a day (BID) | ORAL | 0 refills | Status: DC

## 2018-08-10 MED ORDER — METHOCARBAMOL 500 MG PO TABS
500.0000 mg | ORAL_TABLET | Freq: Once | ORAL | Status: AC
  Administered 2018-08-10: 01:00:00 500 mg via ORAL
  Filled 2018-08-10: qty 1

## 2018-08-10 MED ORDER — NAPROXEN 250 MG PO TABS
500.0000 mg | ORAL_TABLET | Freq: Once | ORAL | Status: AC
  Administered 2018-08-10: 500 mg via ORAL
  Filled 2018-08-10: qty 2

## 2018-08-10 MED ORDER — NAPROXEN 500 MG PO TABS: 500.0000 mg | ORAL_TABLET | Freq: Three times a day (TID) | ORAL | 0 refills | Status: DC | PRN
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
## 2018-08-10 NOTE — Discharge Instructions (Signed)
Take anti-inflammatories and muscle relaxers as prescribed.  Follow-up with your doctor.  Return to the ED if you develop weakness, numbness, tingling, bowel or bladder incontinence or any other concerns.

## 2018-12-07 ENCOUNTER — Other Ambulatory Visit: Payer: Medicaid Other | Admitting: Women's Health

## 2019-03-05 NOTE — L&D Delivery Note (Addendum)
OB/GYN Faculty Practice Delivery Note  Kathy Robertson is a 34 y.o. [id number] s/p vaginal delivery at [redacted]w[redacted]d. She was admitted for IOL due to decreased fetal movement.   ROM: 15h 75m with light meconium fluid GBS Status: Negative/-- (08/25 1425) Maximum Maternal Temperature: 98.16F  Labor Progress: . Initial SVE: 5/90/0.  She then progressed to complete after AROM with short duration of low dose pitocin.   Delivery Date/Time: 11/14/2019 at 0244.  Delivery: Called to room and patient was complete and pushing. Head delivered direct OA. Nuchal cord present x1. Shoulder and body delivered in usual fashion. Infant with spontaneous cry, placed on mother's abdomen, dried and stimulated. Cord clamped x 2 after 1-minute delay, and cut by provider. Cord blood drawn. Placenta delivered spontaneously with gentle cord traction. Fundus firm with massage and Pitocin. Labia, perineum, vagina, and cervix inspected inspected with no lacerations.  Baby Weight: pending  Placenta: Sent to L&D Complications: None Lacerations: None EBL: 50 mL Analgesia: Epidural  Infant:  APGAR (1 MIN): 9 APGAR (5 MINS): 9  [name], DO  Family Medicine PGY-3  11/14/2019, 2:55 AM  GME ATTESTATION:  I saw and evaluated the patient. I agree with the findings and the plan of care as documented in the resident's note.  Alric Seton, MD OB Fellow, Faculty [facility], Center for [facility] 11/14/2019 3:40 AM

## 2019-03-17 ENCOUNTER — Other Ambulatory Visit: Payer: Self-pay

## 2019-03-17 ENCOUNTER — Ambulatory Visit (INDEPENDENT_AMBULATORY_CARE_PROVIDER_SITE_OTHER): Payer: Medicaid Other | Admitting: *Deleted

## 2019-03-17 VITALS — BP 106/64 | HR 71 | Wt 186.5 lb

## 2019-03-17 DIAGNOSIS — Z3201 Encounter for pregnancy test, result positive: Secondary | ICD-10-CM | POA: Diagnosis not present

## 2019-03-17 LAB — POCT URINE PREGNANCY: Preg Test, Ur: POSITIVE — AB

## 2019-03-17 NOTE — Progress Notes (Addendum)
106 64   NURSE VISIT- PREGNANCY CONFIRMATION   SUBJECTIVE:  Kathy Robertson is a 34 y.o. [id number] femaleby certain LMP of Patient's last menstrual period was 02/12/2019 (exact date). Here for pregnancy confirmation.  Home pregnancy test: positive x 4  She reports no complaints.  She is taking prenatal vitamins.    OBJECTIVE:  BP 106/64 (BP Location: Left Arm, Patient Position: Sitting, Cuff Size: Normal)   Pulse 71   Wt 186 lb 8 oz (84.6 kg)   LMP 02/12/2019 (Exact Date)   BMI 29.21 kg/m   Appears well, in no apparent distress OB History  Gravida Para Term Preterm AB Living  6 4 4   1 4   SAB TAB Ectopic Multiple Live Births  1       4    # Outcome Date GA Lbr Len/2nd Weight Sex Delivery Anes PTL Lv  6 Current           5 SAB 04/06/17          4 Term      Vag-Spont   LIV  3 Term      Vag-Spont   LIV  2 Term      Vag-Spont   LIV  1 Term      Vag-Spont   LIV    Results for orders placed or performed in visit on 03/17/19 (from the past 24 hour(s))  POCT urine pregnancy   Collection Time: 03/17/19 10:18 AM  Result Value Ref Range   Preg Test, Ur Positive (A) Negative    ASSESSMENT: Positive pregnancy test by certain LMP   PLAN: Schedule for dating ultrasound in 3 weeks Prenatal vitamins: continue   Nausea medicines: not currently needed   OB packet given: Yes  03/19/19  03/17/2019 10:19 AM Chart reviewed for nurse visit. Agree with plan of care.  03/19/2019, NP 03/17/2019 11:21 AM

## 2019-03-25 ENCOUNTER — Telehealth: Payer: Self-pay | Admitting: *Deleted

## 2019-03-25 NOTE — Telephone Encounter (Signed)
Patient called stating she is having severe nausea and vomiting and is requesting medication.  Encouraged to sip fluids to avoid dehydration. Advised to check with pharmacy a little later today. Verbalized understanding.

## 2019-03-29 ENCOUNTER — Telehealth: Payer: Self-pay | Admitting: *Deleted

## 2019-03-29 ENCOUNTER — Other Ambulatory Visit: Payer: Self-pay | Admitting: Advanced Practice Midwife

## 2019-03-29 MED ORDER — PROMETHAZINE HCL 25 MG PO TABS: 25.0000 mg | ORAL_TABLET | Freq: Four times a day (QID) | ORAL | 1 refills | Status: DC | PRN

## 2019-03-29 MED ORDER — DOXYLAMINE-PYRIDOXINE 10-10 MG PO TBEC: DELAYED_RELEASE_TABLET | ORAL | 6 refills | Status: DC

## 2019-03-29 NOTE — Telephone Encounter (Signed)
LMVOM that nausea medication has been sent to pharmacy. Advised not to drive after taking Phenergan.

## 2019-03-29 NOTE — Telephone Encounter (Signed)
Diclegis and phenergan sent

## 2019-03-29 NOTE — Telephone Encounter (Signed)
Patient called stating she is continuing to have severe nausea and vomiting and is requesting medication.  She is early pregnant.

## 2019-04-06 ENCOUNTER — Other Ambulatory Visit: Payer: Self-pay | Admitting: Obstetrics & Gynecology

## 2019-04-06 DIAGNOSIS — O3680X0 Pregnancy with inconclusive fetal viability, not applicable or unspecified: Secondary | ICD-10-CM

## 2019-04-07 ENCOUNTER — Ambulatory Visit (INDEPENDENT_AMBULATORY_CARE_PROVIDER_SITE_OTHER): Payer: Medicaid Other

## 2019-04-07 ENCOUNTER — Other Ambulatory Visit: Payer: Self-pay

## 2019-04-07 DIAGNOSIS — Z3A01 Less than 8 weeks gestation of pregnancy: Secondary | ICD-10-CM | POA: Diagnosis not present

## 2019-04-07 DIAGNOSIS — O3680X0 Pregnancy with inconclusive fetal viability, not applicable or unspecified: Secondary | ICD-10-CM

## 2019-04-07 NOTE — Progress Notes (Signed)
[place] 7+5 wks,single IUP w/ys,positive fht 150 bpm,anterior left subserosal fibroid 1.9 x 1.3 x 2.1 cm,normal ovaries,crl 14.43 mm

## 2019-04-25 ENCOUNTER — Emergency Department (HOSPITAL_COMMUNITY)
Admission: EM | Admit: 2019-04-25 | Discharge: 2019-04-26 | Disposition: A | Discharge status: Home / Self Care | Payer: Medicaid Other | Attending: Emergency Medicine | Admitting: Emergency Medicine

## 2019-04-25 ENCOUNTER — Encounter (HOSPITAL_COMMUNITY): Payer: Self-pay | Admitting: Emergency Medicine

## 2019-04-25 ENCOUNTER — Other Ambulatory Visit: Payer: Self-pay

## 2019-04-25 DIAGNOSIS — O209 Hemorrhage in early pregnancy, unspecified: Secondary | ICD-10-CM | POA: Diagnosis not present

## 2019-04-25 DIAGNOSIS — R109 Unspecified abdominal pain: Secondary | ICD-10-CM

## 2019-04-25 DIAGNOSIS — A5901 Trichomonal vulvovaginitis: Secondary | ICD-10-CM | POA: Diagnosis not present

## 2019-04-25 DIAGNOSIS — Z3A11 11 weeks gestation of pregnancy: Secondary | ICD-10-CM | POA: Insufficient documentation

## 2019-04-25 DIAGNOSIS — O26891 Other specified pregnancy related conditions, first trimester: Secondary | ICD-10-CM | POA: Diagnosis not present

## 2019-04-25 DIAGNOSIS — R112 Nausea with vomiting, unspecified: Secondary | ICD-10-CM | POA: Diagnosis not present

## 2019-04-25 DIAGNOSIS — R1030 Lower abdominal pain, unspecified: Secondary | ICD-10-CM | POA: Diagnosis not present

## 2019-04-25 LAB — BASIC METABOLIC PANEL
Anion gap: 9 (ref 5–15)
BUN: 7 mg/dL (ref 6–20)
CO2: 26 mmol/L (ref 22–32)
Calcium: 9.1 mg/dL (ref 8.9–10.3)
Chloride: 100 mmol/L (ref 98–111)
Creatinine, Ser: 0.59 mg/dL (ref 0.44–1.00)
GFR calc Af Amer: >60 mL/min (ref >60)
GFR calc non Af Amer: >60 mL/min (ref >60)
Glucose, Bld: 86 mg/dL (ref 70–99)
Potassium: 3.8 mmol/L (ref 3.5–5.1)
Sodium: 135 mmol/L (ref 135–145)

## 2019-04-25 LAB — CBC WITH DIFFERENTIAL/PLATELET
Abs Immature Granulocytes: 0.02 10*3/uL (ref 0.00–0.07)
Basophils Absolute: 0 10*3/uL (ref 0.0–0.1)
Basophils Relative: 0 %
Eosinophils Absolute: 0.1 10*3/uL (ref 0.0–0.5)
Eosinophils Relative: 1 %
HCT: 38.1 % (ref 36.0–46.0)
Hemoglobin: 12.7 g/dL (ref 12.0–15.0)
Immature Granulocytes: 0 %
Lymphocytes Relative: 29 %
Lymphs Abs: 2.6 10*3/uL (ref 0.7–4.0)
MCH: 31.5 pg (ref 26.0–34.0)
MCHC: 33.3 g/dL (ref 30.0–36.0)
MCV: 94.5 fL (ref 80.0–100.0)
Monocytes Absolute: 0.5 10*3/uL (ref 0.1–1.0)
Monocytes Relative: 5 %
Neutro Abs: 5.8 10*3/uL (ref 1.7–7.7)
Neutrophils Relative %: 65 %
Platelets: 282 10*3/uL (ref 150–400)
RBC: 4.03 MIL/uL (ref 3.87–5.11)
RDW: 12.3 % (ref 11.5–15.5)
WBC: 9.1 10*3/uL (ref 4.0–10.5)
nRBC: 0 % (ref 0.0–0.2)

## 2019-04-25 LAB — HCG, QUANTITATIVE, PREGNANCY: hCG, Beta Chain, Quant, S: 44559 m[IU]/mL — ABNORMAL HIGH (ref <5)

## 2019-04-25 LAB — WET PREP, GENITAL
Clue Cells Wet Prep HPF POC: NONE SEEN
Sperm: NONE SEEN
Yeast Wet Prep HPF POC: NONE SEEN

## 2019-04-25 MED ORDER — SODIUM CHLORIDE 0.9 % IV BOLUS
1000.0000 mL | Freq: Once | INTRAVENOUS | Status: AC
  Administered 2019-04-25: 1000 mL via INTRAVENOUS

## 2019-04-25 NOTE — ED Triage Notes (Signed)
Patient states that she is [redacted] weeks pregnant and starting having some spotting/bloody discharge that started yesterday. Patient states sharp pains and cramping along with vomiting.

## 2019-04-25 NOTE — Discharge Instructions (Addendum)
Follow the instructions to return tomorrow for ultrasound.  Take Flagyl twice daily for the next 7 days as directed, take this medication with food.  This will treat her trichomonas infection, this is a sexually transmitted infection.  Your partner will need to be treated as well and you should not be sexually active until you have both completed your treatment.  You have gonorrhea and Chlamydia testing pending and will be called by phone if these results are positive.  Please call tomorrow to schedule close follow-up with your OB/GYN regarding the pain you have been experiencing.

## 2019-04-25 NOTE — ED Provider Notes (Signed)
[facility] EMERGENCY DEPARTMENT Provider Note   CSN: [id number] Arrival date & time: 04/25/19  2121     History Chief Complaint  Patient presents with  . Vaginal Bleeding    [redacted] weeks pregnant    Kathy Robertson is a 34 y.o. female.  Kathy Robertson is a 34 y.o. female with a history of asthma, panic attacks, and multiple miscarriages, who presents to the emergency department with lower abdominal pain and some blood-tinged vaginal discharge.  Patient is currently approximately [redacted] weeks pregnant.  She is followed by Dr[facility] Emel[facility] at [facility], had initial ultrasound on 2/10 that showed IUP with appropriate fetal heart rate.  Reports pain started yesterday she has had some sharp pains and cramping across her lower abdomen and some dull aching in her low back.  She has not had any bright red blood just some pink-tinged vaginal discharge.  She reports that throughout the first trimester she has had severe nausea and vomiting and has had minimal improvement with Phenergan and Diclegis.  She has been trying to stay hydrated.  She denies any upper abdominal pain.  No dysuria or urinary frequency.  No other aggravating or alleviating factors.        Past Medical History:  Diagnosis Date  . Asthma   . Panic attacks anemia    Patient Active Problem List   Diagnosis Date Noted  . Chlamydia 04/25/2017  . Miscarriage 04/14/2017    Past Surgical History:  Procedure Laterality Date  . foot surgery       OB History    Gravida  6   Para  4   Term  4   Preterm      AB  1   Living  4     SAB  1   TAB      Ectopic      Multiple      Live Births  4           Family History  Problem Relation Age of Onset  . Diabetes Father   . Hypertension Father   . Heart attack Father   . Diabetes Mother   . Hypertension Mother   . Heart failure Mother   . Bell's palsy Sister     Social History   Tobacco Use  . Smoking status: Former Games developer  . Smokeless  tobacco: Never Used  Substance Use Topics  . Alcohol use: Not Currently    Comment: occasionally  . Drug use: Yes    Types: Marijuana    Comment: before pregnancy    Home Medications Prior to Admission medications   Medication Sig Start Date End Date Taking? Authorizing Provider  acetaminophen (TYLENOL) 500 MG tablet Take 1,000 mg by mouth every 8 (eight) hours as needed for mild pain or moderate pain.     [provider]  Doxylamine-Pyridoxine (DICLEGIS) 10-10 MG TBEC Take 2 qhs; may also take one in am and one in afternoon prn nausea 03/29/19   Cresenzo-Dishmon, Scarlette Calico, CNM  Multiple Vitamin (MULTIVITAMIN) tablet Take 1 tablet by mouth daily.    [provider]  promethazine (PHENERGAN) 25 MG tablet Take 1 tablet (25 mg total) by mouth every 6 (six) hours as needed for nausea or vomiting. 03/29/19   Cresenzo-Dishmon, Scarlette Calico, CNM    Allergies    Oseltamivir phosphate  Review of Systems   Review of Systems  Constitutional: Negative for chills and fever.  HENT: Negative.   Respiratory: Negative for cough  and shortness of breath.   Cardiovascular: Negative for chest pain.  Gastrointestinal: Positive for abdominal pain, nausea and vomiting. Negative for diarrhea.  Genitourinary: Positive for pelvic pain, vaginal bleeding and vaginal discharge. Negative for dysuria and frequency.  Musculoskeletal: Negative for arthralgias and myalgias.  Neurological: Negative for dizziness, syncope and light-headedness.    Physical Exam Updated Vital Signs BP 119/61 (BP Location: Right Arm)   Pulse 89   Temp 97.9 F (36.6 C) (Oral)   Resp 18   Ht 5\' 7"  (1.702 m)   Wt 83.5 kg   LMP 02/12/2019 (Exact Date)   SpO2 100%   BMI 28.82 kg/m   Physical Exam Vitals and nursing note reviewed.  Constitutional:      General: She is not in acute distress.    Appearance: Normal appearance. She is well-developed and normal weight. She is not ill-appearing or diaphoretic.  HENT:      Head: Normocephalic and atraumatic.  Eyes:     General:        Right eye: No discharge.        Left eye: No discharge.     Pupils: Pupils are equal, round, and reactive to light.  Cardiovascular:     Rate and Rhythm: Normal rate and regular rhythm.     Heart sounds: Normal heart sounds. No murmur. No friction rub. No gallop.   Pulmonary:     Effort: Pulmonary effort is normal. No respiratory distress.     Breath sounds: Normal breath sounds. No wheezing or rales.     Comments: Respirations equal and unlabored, patient able to speak in full sentences, lungs clear to auscultation bilaterally Abdominal:     General: Bowel sounds are normal. There is no distension.     Palpations: Abdomen is soft. There is no mass.     Tenderness: There is abdominal tenderness. There is no guarding.     Comments: Abdomen is, nondistended, bowel sounds are present throughout, there is some tenderness across the lower abdomen without guarding or weakness.  No CVA tenderness bilaterally.  Genitourinary:    Comments: Chaperone present during pelvic exam. No external genital lesions noted. Speculum exam with moderate amount of yellow discharge, no blood present, cervix is closed. Bimanual exam with some discomfort throughout exam, no palpable adnexal masses. Musculoskeletal:        General: No deformity.     Cervical back: Neck supple.  Skin:    General: Skin is warm and dry.     Capillary Refill: Capillary refill takes less than 2 seconds.  Neurological:     Mental Status: She is alert.     Coordination: Coordination normal.     Comments: Speech is clear, able to follow commands Moves extremities without ataxia, coordination intact  Psychiatric:        Mood and Affect: Mood normal.        Behavior: Behavior normal.     ED Results / Procedures / Treatments   Labs (all labs ordered are listed, but only abnormal results are displayed) Labs Reviewed  WET PREP, GENITAL - Abnormal; Notable for the  following components:      Result Value   Trich, Wet Prep PRESENT (*)    WBC, Wet Prep HPF POC MANY (*)    All other components within normal limits  HCG, QUANTITATIVE, PREGNANCY - Abnormal; Notable for the following components:   hCG, Beta Chain, Quant, S 44,559 (*)    All other components within normal limits  BASIC METABOLIC  PANEL  CBC WITH DIFFERENTIAL/PLATELET  GC/CHLAMYDIA PROBE AMP ([place]) NOT AT [facility]    EKG None  Radiology No results found.  Procedures Procedures (including critical care time)  Medications Ordered in ED Medications  sodium chloride 0.9 % bolus 1,000 mL (0 mLs Intravenous Stopped 04/26/19 0019)  metroNIDAZOLE (FLAGYL) tablet 500 mg (500 mg Oral Given 04/26/19 0024)    ED Course  I have reviewed the triage vital signs and the nursing notes.  Pertinent labs & imaging results that were available during my care of the patient were reviewed by me and considered in my medical decision making (see chart for details).   MDM Rules/Calculators/A&P                     34 year old female who is a currently [redacted] weeks pregnant presents with lower abdominal pain and some pink-tinged discharge concerning for bleeding.  She is followed by [facility] and has already had a confirmatory IUP on ultrasound with appropriate fetal heart tones.  On arrival she is well-appearing with normal vitals.  She reports she has had severe nausea and vomiting throughout this pregnancy so far, she has been trying to treat nausea with Phenergan and Diclegis but thinks she is dehydrated.  On exam she has mild tenderness across the lower abdomen.  Pelvic exam reveals moderate yellow vaginal discharge, no bleeding noted, and cervix is clear.  Discomfort present throughout the pelvic exam.  Ultrasound is currently unavailable, and given that patient has already had a confirmed IUP do not think she needs an emergent ultrasound.  Will get lab work, STD testing sent as well given discharge  noted on pelvic exam.  Lab work shows no leukocytosis, normal hemoglobin, no electrolyte derangements despite reporting vomiting.  hCG is appropriate for pregnancy of 11 weeks, wet prep positive for trichomoniasis, in the setting of generalized pelvic tenderness on exam concerning for potential PID, will discuss with OB/GYN.  Case discussed with Dr. Despina Hidden with OB/GYN who states that PID is extremely rare in pregnancy, recommends treating with Flagyl 500 twice daily for 7 days for trichomoniasis, and they will see the patient closely in the office for follow-up.  Since patient is having pelvic pain and ultrasound was not available last night, she has been set up for an ultrasound tomorrow morning.  Given first dose of antibiotics here in the ED as well as 1 L IV fluids given the vomiting.  She reports improvement in her symptoms.  At this time is stable for discharge home.  Return precautions discussed.  Final Clinical Impression(s) / ED Diagnoses Final diagnoses:  Abdominal pain during pregnancy in first trimester  Trichomonal vaginitis    Rx / DC Orders ED Discharge Orders         Ordered    metroNIDAZOLE (FLAGYL) 500 MG tablet  2 times daily     04/26/19 0019    [place] OP OB Comp Less 14 Wks     04/25/19 2341           Dartha Lodge, PA-C 04/28/19 2214    Donnetta Hutching, MD 04/30/19 [phone]

## 2019-04-26 ENCOUNTER — Telehealth: Payer: Self-pay | Admitting: *Deleted

## 2019-04-26 MED ORDER — METRONIDAZOLE 500 MG PO TABS
500.0000 mg | ORAL_TABLET | Freq: Once | ORAL | Status: AC
Start: 2019-04-26 — End: 2019-04-26
  Administered 2019-04-26: 500 mg via ORAL
  Filled 2019-04-26: qty 1

## 2019-04-26 MED ORDER — METRONIDAZOLE 500 MG PO TABS: 500.0000 mg | ORAL_TABLET | Freq: Two times a day (BID) | ORAL | 0 refills | Status: DC

## 2019-04-26 NOTE — ED Notes (Signed)
OB/GYN paged to [facility] @ [phone].

## 2019-04-26 NOTE — Telephone Encounter (Signed)
Patient states she went to the ER last night for bleeding.  Was told she needed to come in for an ultrasound as they were unable to do it at the hospital. They were able to obtain FHT's which were 144bpm.  She was noted to have trich as well.  Bleeding has since subsided and is now very light pink and scant in amount.  Denies recent intercourse or abdominal pain.  Informed patient that at this time, an u/s did not seem necessary as FHT were obtained and she is not currently bleeding and not experiencing any abdominal pain.  Informed bleeding could be from fibroid or trich but to continue to monitor.  Advised to avoid intercourse for 7 days, push fluids, and if bleeding increased or she developed any abdominal pain, to let us know.  Pt verbalized understanding and agreeable to plan.

## 2019-04-27 LAB — GC/CHLAMYDIA PROBE AMP (~~LOC~~) NOT AT ARMC
Chlamydia: NEGATIVE
Neisseria Gonorrhea: NEGATIVE

## 2019-05-11 ENCOUNTER — Other Ambulatory Visit: Payer: Self-pay | Admitting: Obstetrics and Gynecology

## 2019-05-11 DIAGNOSIS — Z3682 Encounter for antenatal screening for nuchal translucency: Secondary | ICD-10-CM

## 2019-05-12 ENCOUNTER — Ambulatory Visit (INDEPENDENT_AMBULATORY_CARE_PROVIDER_SITE_OTHER): Payer: Medicaid Other

## 2019-05-12 ENCOUNTER — Ambulatory Visit (INDEPENDENT_AMBULATORY_CARE_PROVIDER_SITE_OTHER): Payer: Medicaid Other | Admitting: Advanced Practice Midwife

## 2019-05-12 ENCOUNTER — Other Ambulatory Visit: Payer: Self-pay

## 2019-05-12 ENCOUNTER — Encounter: Payer: Self-pay | Admitting: Advanced Practice Midwife

## 2019-05-12 ENCOUNTER — Ambulatory Visit: Payer: Medicaid Other | Admitting: *Deleted

## 2019-05-12 ENCOUNTER — Other Ambulatory Visit (HOSPITAL_COMMUNITY)
Admission: RE | Admit: 2019-05-12 | Discharge: 2019-05-12 | Disposition: A | Discharge status: Home / Self Care | Payer: Medicaid Other | Source: Ambulatory Visit | Attending: Advanced Practice Midwife | Admitting: Advanced Practice Midwife

## 2019-05-12 VITALS — BP 107/68 | HR 67 | Wt 186.0 lb

## 2019-05-12 DIAGNOSIS — A5901 Trichomonal vulvovaginitis: Secondary | ICD-10-CM | POA: Insufficient documentation

## 2019-05-12 DIAGNOSIS — Z3A12 12 weeks gestation of pregnancy: Secondary | ICD-10-CM

## 2019-05-12 DIAGNOSIS — Z3682 Encounter for antenatal screening for nuchal translucency: Secondary | ICD-10-CM | POA: Diagnosis not present

## 2019-05-12 DIAGNOSIS — O23591 Infection of other part of genital tract in pregnancy, first trimester: Secondary | ICD-10-CM

## 2019-05-12 DIAGNOSIS — O09521 Supervision of elderly multigravida, first trimester: Secondary | ICD-10-CM | POA: Diagnosis not present

## 2019-05-12 DIAGNOSIS — O09529 Supervision of elderly multigravida, unspecified trimester: Secondary | ICD-10-CM | POA: Insufficient documentation

## 2019-05-12 DIAGNOSIS — Z302 Encounter for sterilization: Secondary | ICD-10-CM | POA: Insufficient documentation

## 2019-05-12 DIAGNOSIS — Z3480 Encounter for supervision of other normal pregnancy, unspecified trimester: Secondary | ICD-10-CM | POA: Diagnosis present

## 2019-05-12 LAB — POCT URINALYSIS DIPSTICK OB
Blood, UA: NEGATIVE
Glucose, UA: NEGATIVE
Ketones, UA: NEGATIVE
Leukocytes, UA: NEGATIVE
Nitrite, UA: NEGATIVE
POC,PROTEIN,UA: NEGATIVE

## 2019-05-12 MED ORDER — BLOOD PRESSURE MONITOR MISC: 0 refills | Status: AC

## 2019-05-12 NOTE — Patient Instructions (Signed)
Kathy Robertson, I greatly value your feedback.  If you receive a survey following your visit with [place] today, we appreciate you taking the time to fill it out.  Thanks, Philipp Deputy, CNM  [facility] HAS MOVED!!! It is now [facility] at [facility] ([address] [place], [place] [address]) Entrance located off of [address] Free 24/7 valet parking   Nausea & Vomiting  Have saltine crackers or pretzels by your bed and eat a few bites before you raise your head out of bed in the morning  Eat small frequent meals throughout the day instead of large meals  Drink plenty of fluids throughout the day to stay hydrated, just don't drink a lot of fluids with your meals.  This can make your stomach fill up faster making you feel sick  Do not brush your teeth right after you eat  Products with real ginger are good for nausea, like ginger ale and ginger hard candy Make sure it says made with real ginger!  Sucking on sour candy like lemon heads is also good for nausea  If your prenatal vitamins make you nauseated, take them at night so you will sleep through the nausea  Sea Bands  If you feel like you need medicine for the nausea & vomiting please let us know  If you are unable to keep any fluids or food down please let us know   Constipation  Drink plenty of fluid, preferably water, throughout the day  Eat foods high in fiber such as fruits, vegetables, and grains  Exercise, such as walking, is a good way to keep your bowels regular  Drink warm fluids, especially warm prune juice, or decaf coffee  Eat a 1/2 cup of real oatmeal (not instant), 1/2 cup applesauce, and 1/2-1 cup warm prune juice every day  If needed, you may take Colace (docusate sodium) stool softener once or twice a day to help keep the stool soft.   If you still are having problems with constipation, you may take Miralax once daily as needed to help keep your bowels regular.   Home Blood Pressure  Monitoring for Patients   Your provider has recommended that you check your blood pressure (BP) at least once a week at home. If you do not have a blood pressure cuff at home, one will be provided for you. Contact your provider if you have not received your monitor within 1 week.   Helpful Tips for Accurate Home Blood Pressure Checks  . Don't smoke, exercise, or drink caffeine 30 minutes before checking your BP . Use the restroom before checking your BP (a full bladder can raise your pressure) . Relax in a comfortable upright chair . Feet on the ground . Left arm resting comfortably on a flat surface at the level of your heart . Legs uncrossed . Back supported . Sit quietly and don't talk . Place the cuff on your bare arm . Adjust snuggly, so that only two fingertips can fit between your skin and the top of the cuff . Check 2 readings separated by at least one minute . Keep a log of your BP readings . For a visual, please reference this diagram: http://ccnc.care/bpdiagram  Provider Name: [facility]     Phone: [phone]  Zone 1: ALL CLEAR  Continue to monitor your symptoms:  . BP reading is less than 140 (top number) or less than 90 (bottom number)  . No right upper stomach pain .  No headaches or seeing spots . No feeling nauseated or throwing up . No swelling in face and hands  Zone 2: CAUTION Call your doctor's office for any of the following:  . BP reading is greater than 140 (top number) or greater than 90 (bottom number)  . Stomach pain under your ribs in the middle or right side . Headaches or seeing spots . Feeling nauseated or throwing up . Swelling in face and hands  Zone 3: EMERGENCY  Seek immediate medical care if you have any of the following:  . BP reading is greater than160 (top number) or greater than 110 (bottom number) . Severe headaches not improving with Tylenol . Serious difficulty catching your breath . Any worsening symptoms from Zone 2     First Trimester of Pregnancy The first trimester of pregnancy is from week 1 until the end of week 12 (months 1 through 3). A week after a sperm fertilizes an egg, the egg will implant on the wall of the uterus. This embryo will begin to develop into a baby. Genes from you and your partner are forming the baby. The female genes determine whether the baby is a boy or a girl. At 6-8 weeks, the eyes and face are formed, and the heartbeat can be seen on ultrasound. At the end of 12 weeks, all the baby's organs are formed.  Now that you are pregnant, you will want to do everything you can to have a healthy baby. Two of the most important things are to get good prenatal care and to follow your health care provider's instructions. Prenatal care is all the medical care you receive before the baby's birth. This care will help prevent, find, and treat any problems during the pregnancy and childbirth. BODY CHANGES Your body goes through many changes during pregnancy. The changes vary from woman to woman.   You may gain or lose a couple of pounds at first.  You may feel sick to your stomach (nauseous) and throw up (vomit). If the vomiting is uncontrollable, call your health care provider.  You may tire easily.  You may develop headaches that can be relieved by medicines approved by your health care provider.  You may urinate more often. Painful urination may mean you have a bladder infection.  You may develop heartburn as a result of your pregnancy.  You may develop constipation because certain hormones are causing the muscles that push waste through your intestines to slow down.  You may develop hemorrhoids or swollen, bulging veins (varicose veins).  Your breasts may begin to grow larger and become tender. Your nipples may stick out more, and the tissue that surrounds them (areola) may become darker.  Your gums may bleed and may be sensitive to brushing and flossing.  Dark spots or blotches (chloasma,  mask of pregnancy) may develop on your face. This will likely fade after the baby is born.  Your menstrual periods will stop.  You may have a loss of appetite.  You may develop cravings for certain kinds of food.  You may have changes in your emotions from day to day, such as being excited to be pregnant or being concerned that something may go wrong with the pregnancy and baby.  You may have more vivid and strange dreams.  You may have changes in your hair. These can include thickening of your hair, rapid growth, and changes in texture. Some women also have hair loss during or after pregnancy, or hair that feels dry  or thin. Your hair will most likely return to normal after your baby is born. WHAT TO EXPECT AT YOUR PRENATAL VISITS During a routine prenatal visit:  You will be weighed to make sure you and the baby are growing normally.  Your blood pressure will be taken.  Your abdomen will be measured to track your baby's growth.  The fetal heartbeat will be listened to starting around week 10 or 12 of your pregnancy.  Test results from any previous visits will be discussed. Your health care provider may ask you:  How you are feeling.  If you are feeling the baby move.  If you have had any abnormal symptoms, such as leaking fluid, bleeding, severe headaches, or abdominal cramping.  If you have any questions. Other tests that may be performed during your first trimester include:  Blood tests to find your blood type and to check for the presence of any previous infections. They will also be used to check for low iron levels (anemia) and Rh antibodies. Later in the pregnancy, blood tests for diabetes will be done along with other tests if problems develop.  Urine tests to check for infections, diabetes, or protein in the urine.  An ultrasound to confirm the proper growth and development of the baby.  An amniocentesis to check for possible genetic problems.  Fetal screens for  spina bifida and Down syndrome.  You may need other tests to make sure you and the baby are doing well. HOME CARE INSTRUCTIONS  Medicines  Follow your health care provider's instructions regarding medicine use. Specific medicines may be either safe or unsafe to take during pregnancy.  Take your prenatal vitamins as directed.  If you develop constipation, try taking a stool softener if your health care provider approves. Diet  Eat regular, well-balanced meals. Choose a variety of foods, such as meat or vegetable-based protein, fish, milk and low-fat dairy products, vegetables, fruits, and whole grain breads and cereals. Your health care provider will help you determine the amount of weight gain that is right for you.  Avoid raw meat and uncooked cheese. These carry germs that can cause birth defects in the baby.  Eating four or five small meals rather than three large meals a day may help relieve nausea and vomiting. If you start to feel nauseous, eating a few soda crackers can be helpful. Drinking liquids between meals instead of during meals also seems to help nausea and vomiting.  If you develop constipation, eat more high-fiber foods, such as fresh vegetables or fruit and whole grains. Drink enough fluids to keep your urine clear or pale yellow. Activity and Exercise  Exercise only as directed by your health care provider. Exercising will help you:  Control your weight.  Stay in shape.  Be prepared for labor and delivery.  Experiencing pain or cramping in the lower abdomen or low back is a good sign that you should stop exercising. Check with your health care provider before continuing normal exercises.  Try to avoid standing for long periods of time. Move your legs often if you must stand in one place for a long time.  Avoid heavy lifting.  Wear low-heeled shoes, and practice good posture.  You may continue to have sex unless your health care provider directs you  otherwise. Relief of Pain or Discomfort  Wear a good support bra for breast tenderness.    Take warm sitz baths to soothe any pain or discomfort caused by hemorrhoids. Use hemorrhoid cream if your  health care provider approves.    Rest with your legs elevated if you have leg cramps or low back pain.  If you develop varicose veins in your legs, wear support hose. Elevate your feet for 15 minutes, 3-4 times a day. Limit salt in your diet. Prenatal Care  Schedule your prenatal visits by the twelfth week of pregnancy. They are usually scheduled monthly at first, then more often in the last 2 months before delivery.  Write down your questions. Take them to your prenatal visits.  Keep all your prenatal visits as directed by your health care provider. Safety  Wear your seat belt at all times when driving.  Make a list of emergency phone numbers, including numbers for family, friends, the hospital, and police and fire departments. General Tips  Ask your health care provider for a referral to a local prenatal education class. Begin classes no later than at the beginning of month 6 of your pregnancy.  Ask for help if you have counseling or nutritional needs during pregnancy. Your health care provider can offer advice or refer you to specialists for help with various needs.  Do not use hot tubs, steam rooms, or saunas.  Do not douche or use tampons or scented sanitary pads.  Do not cross your legs for long periods of time.  Avoid cat litter boxes and soil used by cats. These carry germs that can cause birth defects in the baby and possibly loss of the fetus by miscarriage or stillbirth.  Avoid all smoking, herbs, alcohol, and medicines not prescribed by your health care provider. Chemicals in these affect the formation and growth of the baby.  Schedule a dentist appointment. At home, brush your teeth with a soft toothbrush and be gentle when you floss. SEEK MEDICAL CARE IF:   You have  dizziness.  You have mild pelvic cramps, pelvic pressure, or nagging pain in the abdominal area.  You have persistent nausea, vomiting, or diarrhea.  You have a bad smelling vaginal discharge.  You have pain with urination.  You notice increased swelling in your face, hands, legs, or ankles. SEEK IMMEDIATE MEDICAL CARE IF:   You have a fever.  You are leaking fluid from your vagina.  You have spotting or bleeding from your vagina.  You have severe abdominal cramping or pain.  You have rapid weight gain or loss.  You vomit blood or material that looks like coffee grounds.  You are exposed to [place] measles and have never had them.  You are exposed to fifth disease or chickenpox.  You develop a severe headache.  You have shortness of breath.  You have any kind of trauma, such as from a fall or a car accident. Document Released: 02/12/2001 Document Revised: 07/05/2013 Document Reviewed: 12/29/2012 [facility] Patient Information 2015 [place], [place]. This information is not intended to replace advice given to you by your health care provider. Make sure you discuss any questions you have with your health care provider.  Coronavirus (COVID-19) Are you at risk?  Are you at risk for the Coronavirus (COVID-19)?  To be considered HIGH RISK for Coronavirus (COVID-19), you have to meet the following criteria:  . Traveled to [place], [place], [place], [place] or [place]; or in the [place] to [place], [place], [place], or [place]; and have fever, cough, and shortness of breath within the last 2 weeks of travel OR . Been in close contact with a person diagnosed with COVID-19 within the last 2 weeks and  have fever, cough, and shortness of breath . IF YOU DO NOT MEET THESE CRITERIA, YOU ARE CONSIDERED LOW RISK FOR COVID-19.  What to do if you are HIGH RISK for COVID-19?  Marland Kitchen If you are having a medical emergency, call 911. . Seek medical care right away. Before you go to a  doctor's office, urgent care or emergency department, call ahead and tell them about your recent travel, contact with someone diagnosed with COVID-19, and your symptoms. You should receive instructions from your physician's office regarding next steps of care.  . When you arrive at healthcare provider, tell the healthcare staff immediately you have returned from visiting [place], [place], [place], [place] or [place]; or traveled in the [place] to [place], [place], [place], or [place]; in the last two weeks or you have been in close contact with a person diagnosed with COVID-19 in the last 2 weeks.   . Tell the health care staff about your symptoms: fever, cough and shortness of breath. . After you have been seen by a medical provider, you will be either: o Tested for (COVID-19) and discharged home on quarantine except to seek medical care if symptoms worsen, and asked to  - Stay home and avoid contact with others until you get your results (4-5 days)  - Avoid travel on public transportation if possible (such as bus, train, or airplane) or o Sent to the Emergency Department by EMS for evaluation, COVID-19 testing, and possible admission depending on your condition and test results.  What to do if you are LOW RISK for COVID-19?  Reduce your risk of any infection by using the same precautions used for avoiding the common cold or flu:  Marland Kitchen Wash your hands often with soap and warm water for at least 20 seconds.  If soap and water are not readily available, use an alcohol-based hand sanitizer with at least 60% alcohol.  . If coughing or sneezing, cover your mouth and nose by coughing or sneezing into the elbow areas of your shirt or coat, into a tissue or into your sleeve (not your hands). . Avoid shaking hands with others and consider head nods or verbal greetings only. . Avoid touching your eyes, nose, or mouth with unwashed hands.  . Avoid close contact with people who are sick. . Avoid  places or events with large numbers of people in one location, like concerts or sporting events. . Carefully consider travel plans you have or are making. . If you are planning any travel outside or inside the [place], visit the CDC's Travelers' Health webpage for the latest health notices. . If you have some symptoms but not all symptoms, continue to monitor at home and seek medical attention if your symptoms worsen. . If you are having a medical emergency, call 911.   [place] / e-Visit: [email or web address]         [facility]: [facility]: [id number]                   [facility]: [phone]

## 2019-05-12 NOTE — Progress Notes (Signed)
[place] 12+5 wks,measurements c/w dates,crl 68.89 mm,NB present,NT 1.4 mm,normal ovaries,fhr 162 bpm,anterior placenta

## 2019-05-12 NOTE — Progress Notes (Signed)
INITIAL OBSTETRICAL VISIT Patient name: Kathy Robertson MRN [id number]  Date of birth: [birth year] Chief Complaint:   Initial Prenatal Visit  History of Present Illness:   Kathy Robertson is a 34 y.o. [id number] African American female at 104w5d by LMP c/w 7wk scan with an Estimated Date of Delivery: 11/19/19 being seen today for her initial obstetrical visit.  Wants btl Her obstetrical history is significant for advanced maternal age at time of delivery; would like BTL. Still in shock over having a pregnancy when her youngest is 65 (that child has autism). Today she reports no complaints.  Patient's last menstrual period was 02/12/2019 (exact date). Last pap not sure. Results were: normal Review of Systems:   Pertinent items are noted in HPI Denies cramping/contractions, leakage of fluid, vaginal bleeding, abnormal vaginal discharge w/ itching/odor/irritation, headaches, visual changes, shortness of breath, chest pain, abdominal pain, severe nausea/vomiting, or problems with urination or bowel movements unless otherwise stated above.  Pertinent History Reviewed:  Reviewed past medical,surgical, social, obstetrical and family history.  Reviewed problem list, medications and allergies. OB History  Gravida Para Term Preterm AB Living  6 4 4   1 4   SAB TAB Ectopic Multiple Live Births  1       4    # Outcome Date GA Lbr Len/2nd Weight Sex Delivery Anes PTL Lv  6 Current           5 SAB 04/06/17          4 Term 05/09/05 [redacted]w[redacted]d  7 lb 7 oz (3.374 kg) M Vag-Spont Other N LIV  3 Term 04/21/03 [redacted]w[redacted]d  6 lb 2 oz (2.778 kg) F Vag-Spont Other N LIV  2 Term 05/31/02 [redacted]w[redacted]d  6 lb 6 oz (2.892 kg) F Vag-Spont Other N LIV  1 Term 03/23/01 [redacted]w[redacted]d  6 lb 6 oz (2.892 kg) F Vag-Spont Other  LIV   Physical Assessment:   Vitals:   05/12/19 0905  BP: 107/68  Pulse: 67  Weight: 186 lb (84.4 kg)  Body mass index is 29.13 kg/m.       Physical Examination:  General appearance - well appearing, and in no  distress  Mental status - alert, oriented to person, place, and time  Psych:  She has a normal mood and affect  Skin - warm and dry, normal color, no suspicious lesions noted  Chest - effort normal, all lung fields clear to auscultation bilaterally  Heart - normal rate and regular rhythm  Abdomen - soft, nontender  Extremities:  No swelling or varicosities noted  Pelvic - VULVA: normal appearing vulva with no masses, tenderness or lesions  VAGINA: normal appearing vagina with normal color and discharge, no lesions  CERVIX: normal appearing cervix without discharge or lesions, no CMT  Thin prep pap is done with HR HPV cotesting & POC trich  TODAY'S NT 07/12/19 12+5 wks,measurements c/w dates,crl 68.89 mm,NB present,NT 1.4 mm,normal ovaries,fhr 162 bpm,anterior placenta   Results for orders placed or performed in visit on 05/12/19 (from the past 24 hour(s))  POC Urinalysis Dipstick OB   Collection Time: 05/12/19  9:30 AM  Result Value Ref Range   Color, UA     Clarity, UA     Glucose, UA Negative Negative   Bilirubin, UA     Ketones, UA neg    Spec Grav, UA     Blood, UA neg    pH, UA     POC,PROTEIN,UA Negative Negative, Trace, Small (1+), Moderate (2+),  Large (3+), 4+   Urobilinogen, UA     Nitrite, UA neg    Leukocytes, UA Negative Negative   Appearance     Odor      Assessment & Plan:  1) Low-Risk Pregnancy [id number] at [redacted]w[redacted]d with an Estimated Date of Delivery: 11/19/19   2) Initial OB visit  3) AMA> will be age 32 at delivery  4) Trichomonas dx/tx at [facility] 04/25/19> POC today  5) Desires ppBTL, will sign papers after 28wks  Meds:  Meds ordered this encounter  Medications  . Blood Pressure Monitor MISC    Sig: For regular home bp monitoring during pregnancy    Dispense:  1 each    Refill:  0    Z34.80    Initial labs obtained Continue prenatal vitamins Reviewed n/v relief measures and warning s/s to report Reviewed recommended weight gain based on pre-gravid  BMI Encouraged well-balanced diet Genetic Screening discussed: requested Cystic fibrosis, SMA, Fragile X screening discussed requested Ultrasound discussed; fetal survey: requested CCNC completed>PCM not here, form faxed The nature of [place] for [employer] with multiple MDs and other Advanced Practice Providers was explained to patient; also emphasized that fellows, residents, and students are part of our team. Needs home bp cuff. Check bp weekly, let us know if >140/90.   Follow-up: Return in about 6 weeks (around 06/23/2019) for [place], 2nd IT, [place]: Anatomy, in person.   Orders Placed This Encounter  Procedures  . Urine Culture  . US OB Comp + 14 Wk  . Pain Management Screening Profile (10S)  . Integrated 1  . Hepatitis C antibody  . Obstetric Panel, Including HIV  . MaterniT 21 plus Core, Blood  . Inheritest Core(CF97,SMA,FraX)  . POC Urinalysis Dipstick OB    Myrtis Ser [facility] 05/12/2019 12:58 PM

## 2019-05-13 LAB — CYTOLOGY - PAP
Comment: NEGATIVE
Comment: NEGATIVE
Diagnosis: NEGATIVE
High risk HPV: NEGATIVE
Trichomonas: POSITIVE — AB

## 2019-05-13 LAB — PMP SCREEN PROFILE (10S), URINE
Amphetamine Scrn, Ur: NEGATIVE ng/mL
BARBITURATE SCREEN URINE: NEGATIVE ng/mL
BENZODIAZEPINE SCREEN, URINE: NEGATIVE ng/mL
CANNABINOIDS UR QL SCN: POSITIVE ng/mL — AB
Cocaine (Metab) Scrn, Ur: NEGATIVE ng/mL
Creatinine(Crt), U: 177.4 mg/dL (ref 20.0–300.0)
Methadone Screen, Urine: NEGATIVE ng/mL
OXYCODONE+OXYMORPHONE UR QL SCN: NEGATIVE ng/mL
Opiate Scrn, Ur: NEGATIVE ng/mL
Ph of Urine: 6.8 (ref 4.5–8.9)
Phencyclidine Qn, Ur: NEGATIVE ng/mL
Propoxyphene Scrn, Ur: NEGATIVE ng/mL

## 2019-05-14 ENCOUNTER — Other Ambulatory Visit: Payer: Self-pay | Admitting: Advanced Practice Midwife

## 2019-05-14 ENCOUNTER — Encounter: Payer: Self-pay | Admitting: Advanced Practice Midwife

## 2019-05-14 DIAGNOSIS — F1290 Cannabis use, unspecified, uncomplicated: Secondary | ICD-10-CM | POA: Insufficient documentation

## 2019-05-14 LAB — URINE CULTURE

## 2019-05-14 MED ORDER — METRONIDAZOLE 500 MG PO TABS: 2000.0000 mg | ORAL_TABLET | Freq: Once | ORAL | 0 refills | Status: AC

## 2019-05-25 LAB — INTEGRATED 1
Crown Rump Length: 68.9 mm
Gest. Age on Collection Date: 13 weeks
Maternal Age at EDD: 35.4 yr
Nuchal Translucency (NT): 1.4 mm
Number of Fetuses: 1
PAPP-A Value: 681.3 ng/mL
Weight: 186 [lb_av]

## 2019-05-25 LAB — OBSTETRIC PANEL, INCLUDING HIV
Antibody Screen: NEGATIVE
Basophils Absolute: 0 10*3/uL (ref 0.0–0.2)
Basos: 0 %
EOS (ABSOLUTE): 0 10*3/uL (ref 0.0–0.4)
Eos: 1 %
HIV Screen 4th Generation wRfx: NONREACTIVE
Hematocrit: 36.2 % (ref 34.0–46.6)
Hemoglobin: 12.1 g/dL (ref 11.1–15.9)
Hepatitis B Surface Ag: NEGATIVE
Immature Grans (Abs): 0 10*3/uL (ref 0.0–0.1)
Immature Granulocytes: 0 %
Lymphocytes Absolute: 1.9 10*3/uL (ref 0.7–3.1)
Lymphs: 27 %
MCH: 31 pg (ref 26.6–33.0)
MCHC: 33.4 g/dL (ref 31.5–35.7)
MCV: 93 fL (ref 79–97)
Monocytes Absolute: 0.4 10*3/uL (ref 0.1–0.9)
Monocytes: 6 %
Neutrophils Absolute: 4.5 10*3/uL (ref 1.4–7.0)
Neutrophils: 66 %
Platelets: 271 10*3/uL (ref 150–450)
RBC: 3.9 x10E6/uL (ref 3.77–5.28)
RDW: 12 % (ref 11.7–15.4)
RPR Ser Ql: NONREACTIVE
Rh Factor: POSITIVE
Rubella Antibodies, IGG: 1.4 index (ref >0.99)
WBC: 6.9 10*3/uL (ref 3.4–10.8)

## 2019-05-25 LAB — MATERNIT 21 PLUS CORE, BLOOD
Fetal Fraction: 8
Result (T21): NEGATIVE
Trisomy 13 (Patau syndrome): NEGATIVE
Trisomy 18 (Edwards syndrome): NEGATIVE
Trisomy 21 (Down syndrome): NEGATIVE

## 2019-05-25 LAB — INHERITEST CORE(CF97,SMA,FRAX)

## 2019-05-25 LAB — HEPATITIS C ANTIBODY: Hep C Virus Ab: <0.1 s/co ratio (ref 0.0–0.9)

## 2019-06-22 ENCOUNTER — Other Ambulatory Visit: Payer: Self-pay | Admitting: Advanced Practice Midwife

## 2019-06-22 DIAGNOSIS — Z3480 Encounter for supervision of other normal pregnancy, unspecified trimester: Secondary | ICD-10-CM

## 2019-06-22 DIAGNOSIS — Z363 Encounter for antenatal screening for malformations: Secondary | ICD-10-CM

## 2019-06-23 ENCOUNTER — Other Ambulatory Visit (HOSPITAL_COMMUNITY)
Admission: RE | Admit: 2019-06-23 | Discharge: 2019-06-23 | Disposition: A | Discharge status: Home / Self Care | Payer: Medicaid Other | Source: Ambulatory Visit | Attending: Advanced Practice Midwife | Admitting: Advanced Practice Midwife

## 2019-06-23 ENCOUNTER — Other Ambulatory Visit: Payer: Self-pay

## 2019-06-23 ENCOUNTER — Ambulatory Visit (INDEPENDENT_AMBULATORY_CARE_PROVIDER_SITE_OTHER): Payer: Medicaid Other

## 2019-06-23 ENCOUNTER — Ambulatory Visit (INDEPENDENT_AMBULATORY_CARE_PROVIDER_SITE_OTHER): Payer: Medicaid Other | Admitting: Advanced Practice Midwife

## 2019-06-23 VITALS — BP 117/78 | HR 72 | Wt 195.0 lb

## 2019-06-23 DIAGNOSIS — O23591 Infection of other part of genital tract in pregnancy, first trimester: Secondary | ICD-10-CM | POA: Insufficient documentation

## 2019-06-23 DIAGNOSIS — O09522 Supervision of elderly multigravida, second trimester: Secondary | ICD-10-CM

## 2019-06-23 DIAGNOSIS — Z363 Encounter for antenatal screening for malformations: Secondary | ICD-10-CM

## 2019-06-23 DIAGNOSIS — Z3A18 18 weeks gestation of pregnancy: Secondary | ICD-10-CM

## 2019-06-23 DIAGNOSIS — Z331 Pregnant state, incidental: Secondary | ICD-10-CM

## 2019-06-23 DIAGNOSIS — A5901 Trichomonal vulvovaginitis: Secondary | ICD-10-CM | POA: Insufficient documentation

## 2019-06-23 DIAGNOSIS — Z302 Encounter for sterilization: Secondary | ICD-10-CM

## 2019-06-23 DIAGNOSIS — Z3480 Encounter for supervision of other normal pregnancy, unspecified trimester: Secondary | ICD-10-CM

## 2019-06-23 DIAGNOSIS — O23592 Infection of other part of genital tract in pregnancy, second trimester: Secondary | ICD-10-CM

## 2019-06-23 DIAGNOSIS — Z1389 Encounter for screening for other disorder: Secondary | ICD-10-CM

## 2019-06-23 DIAGNOSIS — F1290 Cannabis use, unspecified, uncomplicated: Secondary | ICD-10-CM

## 2019-06-23 DIAGNOSIS — Z1379 Encounter for other screening for genetic and chromosomal anomalies: Secondary | ICD-10-CM

## 2019-06-23 LAB — POCT URINALYSIS DIPSTICK OB
Blood, UA: NEGATIVE
Glucose, UA: NEGATIVE
Ketones, UA: NEGATIVE
Leukocytes, UA: NEGATIVE
Nitrite, UA: NEGATIVE
POC,PROTEIN,UA: NEGATIVE

## 2019-06-23 NOTE — Patient Instructions (Signed)
Kathy Robertson, I greatly value your feedback.  If you receive a survey following your visit with [place] today, we appreciate you taking the time to fill it out.  Thanks, Philipp Deputy, CNM  [facility] ([address] [place], [place] [address]) Entrance C, located off of [address] valet parking  Go to [email or web address] to register for FREE online childbirth classes  [place] Pediatricians/Family Doctors:  [facility] [phone]            [facility] [phone]                 [facility] [phone] (usually not accepting new patients unless you have family there already, you are always welcome to call and ask)       [facility] Department [phone]       [facility] Pediatricians/Family Doctors:   [facility]: [phone]  [facility]: [phone]  [facility]: [phone]  [facility] Doctors:   [facility]: [phone]   [facility]: [phone]  [facility] Doctors:  [facility]: [phone]    Home Blood Pressure Monitoring for Patients   Your provider has recommended that you check your blood pressure (BP) at least once a week at home. If you do not have a blood pressure cuff at home, one will be provided for you. Contact your provider if you have not received your monitor within 1 week.   Helpful Tips for Accurate Home Blood Pressure Checks  . Don't smoke, exercise, or drink caffeine 30 minutes before checking your BP . Use the restroom before checking your BP (a full bladder can raise your pressure) . Relax in a comfortable upright chair . Feet on the ground . Left arm resting comfortably on a flat surface at the level of your heart . Legs uncrossed . Back supported . Sit quietly and don't talk . Place the cuff on your bare arm . Adjust snuggly, so that only  two fingertips can fit between your skin and the top of the cuff . Check 2 readings separated by at least one minute . Keep a log of your BP readings . For a visual, please reference this diagram: http://ccnc.care/bpdiagram  Provider Name: [facility]     Phone: [phone]  Zone 1: ALL CLEAR  Continue to monitor your symptoms:  . BP reading is less than 140 (top number) or less than 90 (bottom number)  . No right upper stomach pain . No headaches or seeing spots . No feeling nauseated or throwing up . No swelling in face and hands  Zone 2: CAUTION Call your doctor's office for any of the following:  . BP reading is greater than 140 (top number) or greater than 90 (bottom number)  . Stomach pain under your ribs in the middle or right side . Headaches or seeing spots . Feeling nauseated or throwing up . Swelling in face and hands  Zone 3: EMERGENCY  Seek immediate medical care if you have any of the following:  . BP reading is greater than160 (top number) or greater than 110 (bottom number) . Severe headaches not improving with Tylenol . Serious difficulty catching your breath . Any worsening symptoms from Zone 2     Second Trimester of Pregnancy The second trimester is from week 14 through week 27 (months 4 through 6). The second trimester is often a time when you feel your best.  Your body has adjusted to being pregnant, and you begin to feel better physically. Usually, morning sickness has lessened or quit completely, you may have more energy, and you may have an increase in appetite. The second trimester is also a time when the fetus is growing rapidly. At the end of the sixth month, the fetus is about 9 inches long and weighs about 1 pounds. You will likely begin to feel the baby move (quickening) between 16 and 20 weeks of pregnancy. Body changes during your second trimester Your body continues to go through many changes during your second trimester. The changes vary  from woman to woman.  Your weight will continue to increase. You will notice your lower abdomen bulging out.  You may begin to get stretch marks on your hips, abdomen, and breasts.  You may develop headaches that can be relieved by medicines. The medicines should be approved by your health care provider.  You may urinate more often because the fetus is pressing on your bladder.  You may develop or continue to have heartburn as a result of your pregnancy.  You may develop constipation because certain hormones are causing the muscles that push waste through your intestines to slow down.  You may develop hemorrhoids or swollen, bulging veins (varicose veins).  You may have back pain. This is caused by: ? Weight gain. ? Pregnancy hormones that are relaxing the joints in your pelvis. ? A shift in weight and the muscles that support your balance.  Your breasts will continue to grow and they will continue to become tender.  Your gums may bleed and may be sensitive to brushing and flossing.  Dark spots or blotches (chloasma, mask of pregnancy) may develop on your face. This will likely fade after the baby is born.  A dark line from your belly button to the pubic area (linea nigra) may appear. This will likely fade after the baby is born.  You may have changes in your hair. These can include thickening of your hair, rapid growth, and changes in texture. Some women also have hair loss during or after pregnancy, or hair that feels dry or thin. Your hair will most likely return to normal after your baby is born.  What to expect at prenatal visits During a routine prenatal visit:  You will be weighed to make sure you and the fetus are growing normally.  Your blood pressure will be taken.  Your abdomen will be measured to track your baby's growth.  The fetal heartbeat will be listened to.  Any test results from the previous visit will be discussed.  Your health care provider may ask  you:  How you are feeling.  If you are feeling the baby move.  If you have had any abnormal symptoms, such as leaking fluid, bleeding, severe headaches, or abdominal cramping.  If you are using any tobacco products, including cigarettes, chewing tobacco, and electronic cigarettes.  If you have any questions.  Other tests that may be performed during your second trimester include:  Blood tests that check for: ? Low iron levels (anemia). ? High blood sugar that affects pregnant women (gestational diabetes) between 14 and 28 weeks. ? Rh antibodies. This is to check for a protein on red blood cells (Rh factor).  Urine tests to check for infections, diabetes, or protein in the urine.  An ultrasound to confirm the proper growth and development of the baby.  An amniocentesis to check for possible genetic problems.  Fetal screens  for spina bifida and Down syndrome.  HIV (human immunodeficiency virus) testing. Routine prenatal testing includes screening for HIV, unless you choose not to have this test.  Follow these instructions at home: Medicines  Follow your health care provider's instructions regarding medicine use. Specific medicines may be either safe or unsafe to take during pregnancy.  Take a prenatal vitamin that contains at least 600 micrograms (mcg) of folic acid.  If you develop constipation, try taking a stool softener if your health care provider approves. Eating and drinking  Eat a balanced diet that includes fresh fruits and vegetables, whole grains, good sources of protein such as meat, eggs, or tofu, and low-fat dairy. Your health care provider will help you determine the amount of weight gain that is right for you.  Avoid raw meat and uncooked cheese. These carry germs that can cause birth defects in the baby.  If you have low calcium intake from food, talk to your health care provider about whether you should take a daily calcium supplement.  Limit foods that  are high in fat and processed sugars, such as fried and sweet foods.  To prevent constipation: ? Drink enough fluid to keep your urine clear or pale yellow. ? Eat foods that are high in fiber, such as fresh fruits and vegetables, whole grains, and beans. Activity  Exercise only as directed by your health care provider. Most women can continue their usual exercise routine during pregnancy. Try to exercise for 30 minutes at least 5 days a week. Stop exercising if you experience uterine contractions.  Avoid heavy lifting, wear low heel shoes, and practice good posture.  A sexual relationship may be continued unless your health care provider directs you otherwise. Relieving pain and discomfort  Wear a good support bra to prevent discomfort from breast tenderness.  Take warm sitz baths to soothe any pain or discomfort caused by hemorrhoids. Use hemorrhoid cream if your health care provider approves.  Rest with your legs elevated if you have leg cramps or low back pain.  If you develop varicose veins, wear support hose. Elevate your feet for 15 minutes, 3-4 times a day. Limit salt in your diet. Prenatal Care  Write down your questions. Take them to your prenatal visits.  Keep all your prenatal visits as told by your health care provider. This is important. Safety  Wear your seat belt at all times when driving.  Make a list of emergency phone numbers, including numbers for family, friends, the hospital, and police and fire departments. General instructions  Ask your health care provider for a referral to a local prenatal education class. Begin classes no later than the beginning of month 6 of your pregnancy.  Ask for help if you have counseling or nutritional needs during pregnancy. Your health care provider can offer advice or refer you to specialists for help with various needs.  Do not use hot tubs, steam rooms, or saunas.  Do not douche or use tampons or scented sanitary  pads.  Do not cross your legs for long periods of time.  Avoid cat litter boxes and soil used by cats. These carry germs that can cause birth defects in the baby and possibly loss of the fetus by miscarriage or stillbirth.  Avoid all smoking, herbs, alcohol, and unprescribed drugs. Chemicals in these products can affect the formation and growth of the baby.  Do not use any products that contain nicotine or tobacco, such as cigarettes and e-cigarettes. If you need help quitting,  ask your health care provider.  Visit your dentist if you have not gone yet during your pregnancy. Use a soft toothbrush to brush your teeth and be gentle when you floss. Contact a health care provider if:  You have dizziness.  You have mild pelvic cramps, pelvic pressure, or nagging pain in the abdominal area.  You have persistent nausea, vomiting, or diarrhea.  You have a bad smelling vaginal discharge.  You have pain when you urinate. Get help right away if:  You have a fever.  You are leaking fluid from your vagina.  You have spotting or bleeding from your vagina.  You have severe abdominal cramping or pain.  You have rapid weight gain or weight loss.  You have shortness of breath with chest pain.  You notice sudden or extreme swelling of your face, hands, ankles, feet, or legs.  You have not felt your baby move in over an hour.  You have severe headaches that do not go away when you take medicine.  You have vision changes. Summary  The second trimester is from week 14 through week 27 (months 4 through 6). It is also a time when the fetus is growing rapidly.  Your body goes through many changes during pregnancy. The changes vary from woman to woman.  Avoid all smoking, herbs, alcohol, and unprescribed drugs. These chemicals affect the formation and growth your baby.  Do not use any tobacco products, such as cigarettes, chewing tobacco, and e-cigarettes. If you need help quitting, ask your  health care provider.  Contact your health care provider if you have any questions. Keep all prenatal visits as told by your health care provider. This is important. This information is not intended to replace advice given to you by your health care provider. Make sure you discuss any questions you have with your health care provider. Document Released: 02/12/2001 Document Revised: 07/27/2015 Document Reviewed: 04/21/2012 Elsevier Interactive Patient Education  2017 [place] FLU! Because you are pregnant, we at [facility], along with the Centers for Disease Control (CDC), recommend that you receive the flu vaccine to protect yourself and your baby from the flu. The flu is more likely to cause severe illness in pregnant women than in women of reproductive age who are not pregnant. Changes in the immune system, heart, and lungs during pregnancy make pregnant women (and women up to two weeks postpartum) more prone to severe illness from flu, including illness resulting in hospitalization. Flu also may be harmful for a pregnant woman's developing baby. A common flu symptom is fever, which may be associated with neural tube defects and other adverse outcomes for a developing baby. Getting vaccinated can also help protect a baby after birth from flu. (Mom passes antibodies onto the developing baby during her pregnancy.)  A Flu Vaccine is the Best Protection Against Flu Getting a flu vaccine is the first and most important step in protecting against flu. Pregnant women should get a flu shot and not the live attenuated influenza vaccine (LAIV), also known as nasal spray flu vaccine. Flu vaccines given during pregnancy help protect both the mother and her baby from flu. Vaccination has been shown to reduce the risk of flu-associated acute respiratory infection in pregnant women by up to one-half. A 2018 study showed that getting a flu shot reduced a pregnant woman's  risk of being hospitalized with flu by an average of 40 percent. Pregnant women who get a flu  vaccine are also helping to protect their babies from flu illness for the first several months after their birth, when they are too young to get vaccinated.   A Long Record of Safety for Flu Shots in Pregnant Women Flu shots have been given to millions of pregnant women over many years with a good safety record. There is a lot of evidence that flu vaccines can be given safely during pregnancy; though these data are limited for the first trimester. The CDC recommends that pregnant women get vaccinated during any trimester of their pregnancy. It is very important for pregnant women to get the flu shot.   Other Preventive Actions In addition to getting a flu shot, pregnant women should take the same everyday preventive actions the CDC recommends of everyone, including covering coughs, washing hands often, and avoiding people who are sick.  Symptoms and Treatment If you get sick with flu symptoms call your doctor right away. There are antiviral drugs that can treat flu illness and prevent serious flu complications. The CDC recommends prompt treatment for people who have influenza infection or suspected influenza infection and who are at high risk of serious flu complications, such as people with asthma, diabetes (including gestational diabetes), or heart disease. Early treatment of influenza in hospitalized pregnant women has been shown to reduce the length of the hospital stay.  Symptoms Flu symptoms include fever, cough, sore throat, runny or stuffy nose, body aches, headache, chills and fatigue. Some people may also have vomiting and diarrhea. People may be infected with the flu and have respiratory symptoms without a fever.  Early Treatment is Important for Pregnant Women Treatment should begin as soon as possible because antiviral drugs work best when started early (within 48 hours after symptoms  start). Antiviral drugs can make your flu illness milder and make you feel better faster. They may also prevent serious health problems that can result from flu illness. Oral oseltamivir (Tamiflu) is the preferred treatment for pregnant women because it has the most studies available to suggest that it is safe and beneficial. Antiviral drugs require a prescription from your provider. Having a fever caused by flu infection or other infections early in pregnancy may be linked to birth defects in a baby. In addition to taking antiviral drugs, pregnant women who get a fever should treat their fever with Tylenol (acetaminophen) and contact their provider immediately.  When to [place] If you are pregnant and have any of these signs, seek care immediately:  Difficulty breathing or shortness of breath  Pain or pressure in the chest or abdomen  Sudden dizziness  Confusion  Severe or persistent vomiting  High fever that is not responding to Tylenol (or store brand equivalent)  Decreased or no movement of your baby  [email or web address]

## 2019-06-23 NOTE — Progress Notes (Signed)
   LOW-RISK PREGNANCY VISIT Patient name: Kathy Robertson MRN [id number]  Date of birth: 10-07-1985 Chief Complaint:   Routine Prenatal Visit (2nd IT Ultrasound)  History of Present Illness:   Kathy Robertson is a 34 y.o. [id number] female at [redacted]w[redacted]d with an Estimated Date of Delivery: 11/19/19 being seen today for ongoing management of a low-risk pregnancy.  Today she reports feeling much better, nausea much improved!. Contractions: Not present. Vag. Bleeding: None.   . denies leaking of fluid. Review of Systems:   Pertinent items are noted in HPI Denies abnormal vaginal discharge w/ itching/odor/irritation, headaches, visual changes, shortness of breath, chest pain, abdominal pain, severe nausea/vomiting, or problems with urination or bowel movements unless otherwise stated above. Pertinent History Reviewed:  Reviewed past medical,surgical, social, obstetrical and family history.  Reviewed problem list, medications and allergies. Physical Assessment:   Vitals:   06/23/19 0918  BP: 117/78  Pulse: 72  Weight: 195 lb (88.5 kg)  Body mass index is 30.54 kg/m.        Physical Examination:   General appearance: Well appearing, and in no distress  Mental status: Alert, oriented to person, place, and time  Skin: Warm & dry  Cardiovascular: Normal heart rate noted  Respiratory: Normal respiratory effort, no distress  Abdomen: Soft, gravid, nontender  Pelvic: Cervical exam deferred         Extremities: Edema: None  Fetal Status: Fetal Heart Rate (bpm): 144 u/s         Anatomy u/s: [place] 18+5 wks,cephalic,anterior placenta gr 0,normal ovaries,fhr 144 bpm,LVEICF 2.1 mm,cx 4.9 cm,svp of fluid 5.4 cm,efw 273 g 66%,anatomy complete  Results for orders placed or performed in visit on 06/23/19 (from the past 24 hour(s))  POC Urinalysis Dipstick OB   Collection Time: 06/23/19  9:17 AM  Result Value Ref Range   Color, UA     Clarity, UA     Glucose, UA Negative Negative   Bilirubin, UA      Ketones, UA neg    Spec Grav, UA     Blood, UA neg    pH, UA     POC,PROTEIN,UA Negative Negative, Trace, Small (1+), Moderate (2+), Large (3+), 4+   Urobilinogen, UA     Nitrite, UA neg    Leukocytes, UA Negative Negative   Appearance     Odor      Assessment & Plan:  1) Low-risk pregnancy [id number] at [redacted]w[redacted]d with an Estimated Date of Delivery: 11/19/19   2) Previous positive trichomonas x 2, POC today; states partner says he was tx and they abstained from sex for 1 week after tx  3) Isolated LVEICF, 2.60mm, neg Maternit21, no f/u needed   Meds: No orders of the defined types were placed in this encounter.  Labs/procedures today: NuSwab (trich POC)  Plan:  Continue routine obstetrical care   Reviewed: Preterm labor symptoms and general obstetric precautions including but not limited to vaginal bleeding, contractions, leaking of fluid and fetal movement were reviewed in detail with the patient.  All questions were answered. Didn't ask about home bp cuff. Check bp weekly, let us know if >140/90.   Follow-up: Return in about 4 weeks (around 07/21/2019) for LROB, in person.  Orders Placed This Encounter  Procedures  . INTEGRATED 2  . POC Urinalysis Dipstick OB   Arabella Merles [facility] 06/23/2019 10:06 AM

## 2019-06-23 NOTE — Progress Notes (Signed)
[place] 18+5 wks,cephalic,anterior placenta gr 0,normal ovaries,fhr 144 bpm,LVEICF 2.1 mm,cx 4.9 cm,svp of fluid 5.4 cm,efw 273 g 66%,anatomy complete

## 2019-06-24 LAB — CERVICOVAGINAL ANCILLARY ONLY
Comment: NEGATIVE
Trichomonas: NEGATIVE

## 2019-06-25 LAB — INTEGRATED 2
AFP MoM: 2.06
Alpha-Fetoprotein: 93.3 ng/mL
Crown Rump Length: 68.9 mm
DIA MoM: 1.95
DIA Value: 283.8 pg/mL
Estriol, Unconjugated: 1.71 ng/mL
Gest. Age on Collection Date: 13 weeks
Gestational Age: 19 weeks
Maternal Age at EDD: 35.4 yr
Nuchal Translucency (NT): 1.4 mm
Nuchal Translucency MoM: 0.9
Number of Fetuses: 1
PAPP-A MoM: 0.8
PAPP-A Value: 681.3 ng/mL
Test Results:: NEGATIVE
Weight: 186 [lb_av]
Weight: 186 [lb_av]
hCG MoM: 0.75
hCG Value: 14.9 IU/mL
uE3 MoM: 1.03

## 2019-07-21 ENCOUNTER — Other Ambulatory Visit: Payer: Self-pay

## 2019-07-21 ENCOUNTER — Ambulatory Visit (INDEPENDENT_AMBULATORY_CARE_PROVIDER_SITE_OTHER): Payer: Medicaid Other | Admitting: Advanced Practice Midwife

## 2019-07-21 VITALS — BP 124/69 | HR 79 | Wt 199.2 lb

## 2019-07-21 DIAGNOSIS — Z1389 Encounter for screening for other disorder: Secondary | ICD-10-CM

## 2019-07-21 DIAGNOSIS — Z0283 Encounter for blood-alcohol and blood-drug test: Secondary | ICD-10-CM

## 2019-07-21 DIAGNOSIS — Z1379 Encounter for other screening for genetic and chromosomal anomalies: Secondary | ICD-10-CM

## 2019-07-21 DIAGNOSIS — O99322 Drug use complicating pregnancy, second trimester: Secondary | ICD-10-CM

## 2019-07-21 DIAGNOSIS — O09522 Supervision of elderly multigravida, second trimester: Secondary | ICD-10-CM

## 2019-07-21 DIAGNOSIS — Z3A22 22 weeks gestation of pregnancy: Secondary | ICD-10-CM

## 2019-07-21 DIAGNOSIS — Z331 Pregnant state, incidental: Secondary | ICD-10-CM

## 2019-07-21 LAB — POCT URINALYSIS DIPSTICK OB
Blood, UA: NEGATIVE
Glucose, UA: NEGATIVE
Ketones, UA: NEGATIVE
Leukocytes, UA: NEGATIVE
Nitrite, UA: NEGATIVE
POC,PROTEIN,UA: NEGATIVE

## 2019-07-21 MED ORDER — ONDANSETRON 4 MG PO TBDP: 4.0000 mg | ORAL_TABLET | Freq: Three times a day (TID) | ORAL | 1 refills | Status: DC | PRN

## 2019-07-21 NOTE — Patient Instructions (Signed)
Kathy Robertson, I greatly value your feedback.  If you receive a survey following your visit with [place] today, we appreciate you taking the time to fill it out.  Thanks, Philipp Deputy, CNM   You will have your sugar test next visit.  Please do not eat or drink anything after midnight the night before you come, not even water.  You will be here for at least two hours.  Please make an appointment online for the bloodwork at [email or web address] for 8:30am (or as close to this as possible). Make sure you select the [facility] service center. The day of the appointment, check in with our office first, then you will go to [facility] to start the sugar test.    [facility] HAS MOVED!!! It is now [facility] ([address] [place], [place] [address]) Entrance C, located off of [address] valet parking  Go to [email or web address] to register for FREE online childbirth classes   Call the office [phone]) or go to [facility] if:  You begin to have strong, frequent contractions  Your water breaks.  Sometimes it is a big gush of fluid, sometimes it is just a trickle that keeps getting your panties wet or running down your legs  You have vaginal bleeding.  It is normal to have a small amount of spotting if your cervix was checked.   You don't feel your baby moving like normal.  If you don't, get you something to eat and drink and lay down and focus on feeling your baby move.   If your baby is still not moving like normal, you should call the office or go to [facility].  [place] Pediatricians/Family Doctors:  [facility] [phone]            [facility] [phone]                 [facility] [phone] (usually not accepting new patients unless you have family there already, you are always welcome to call and ask)       [facility] Department [phone]       [facility] Pediatricians/Family Doctors:    [facility]: [phone]  [facility]: [phone]  [facility]: [phone]  [facility] Doctors:   [facility]: [phone]   [facility]: [phone]  [facility] Doctors:  [facility]: [phone]   Home Blood Pressure Monitoring for Patients   Your provider has recommended that you check your blood pressure (BP) at least once a week at home. If you do not have a blood pressure cuff at home, one will be provided for you. Contact your provider if you have not received your monitor within 1 week.   Helpful Tips for Accurate Home Blood Pressure Checks  . Don't smoke, exercise, or drink caffeine 30 minutes before checking your BP . Use the restroom before checking your BP (a full bladder can raise your pressure) . Relax in a comfortable upright chair . Feet on the ground . Left arm resting comfortably on a flat surface at the level of your heart . Legs uncrossed . Back supported . Sit quietly and don't talk . Place the cuff on your bare arm . Adjust snuggly, so that only two fingertips can fit between your skin and the top of the cuff . Check 2 readings separated by at least one minute . Keep a log of your BP  readings . For a visual, please reference this diagram: http://ccnc.care/bpdiagram  Provider Name: [facility]     Phone: [phone]  Zone 1: ALL CLEAR  Continue to monitor your symptoms:  . BP reading is less than 140 (top number) or less than 90 (bottom number)  . No right upper stomach pain . No headaches or seeing spots . No feeling nauseated or throwing up . No swelling in face and hands  Zone 2: CAUTION Call your doctor's office for any of the following:  . BP reading is greater than 140 (top number) or greater than 90 (bottom number)  . Stomach pain under your ribs in the middle or right side . Headaches or seeing spots . Feeling  nauseated or throwing up . Swelling in face and hands  Zone 3: EMERGENCY  Seek immediate medical care if you have any of the following:  . BP reading is greater than160 (top number) or greater than 110 (bottom number) . Severe headaches not improving with Tylenol . Serious difficulty catching your breath . Any worsening symptoms from Zone 2   Second Trimester of Pregnancy The second trimester is from week 13 through week 28, months 4 through 6. The second trimester is often a time when you feel your best. Your body has also adjusted to being pregnant, and you begin to feel better physically. Usually, morning sickness has lessened or quit completely, you may have more energy, and you may have an increase in appetite. The second trimester is also a time when the fetus is growing rapidly. At the end of the sixth month, the fetus is about 9 inches long and weighs about 1 pounds. You will likely begin to feel the baby move (quickening) between 18 and 20 weeks of the pregnancy. BODY CHANGES Your body goes through many changes during pregnancy. The changes vary from woman to woman.   Your weight will continue to increase. You will notice your lower abdomen bulging out.  You may begin to get stretch marks on your hips, abdomen, and breasts.  You may develop headaches that can be relieved by medicines approved by your health care provider.  You may urinate more often because the fetus is pressing on your bladder.  You may develop or continue to have heartburn as a result of your pregnancy.  You may develop constipation because certain hormones are causing the muscles that push waste through your intestines to slow down.  You may develop hemorrhoids or swollen, bulging veins (varicose veins).  You may have back pain because of the weight gain and pregnancy hormones relaxing your joints between the bones in your pelvis and as a result of a shift in weight and the muscles that support your  balance.  Your breasts will continue to grow and be tender.  Your gums may bleed and may be sensitive to brushing and flossing.  Dark spots or blotches (chloasma, mask of pregnancy) may develop on your face. This will likely fade after the baby is born.  A dark line from your belly button to the pubic area (linea nigra) may appear. This will likely fade after the baby is born.  You may have changes in your hair. These can include thickening of your hair, rapid growth, and changes in texture. Some women also have hair loss during or after pregnancy, or hair that feels dry or thin. Your hair will most likely return to normal after your baby is born. WHAT TO EXPECT AT YOUR PRENATAL VISITS During  a routine prenatal visit:  You will be weighed to make sure you and the fetus are growing normally.  Your blood pressure will be taken.  Your abdomen will be measured to track your baby's growth.  The fetal heartbeat will be listened to.  Any test results from the previous visit will be discussed. Your health care provider may ask you:  How you are feeling.  If you are feeling the baby move.  If you have had any abnormal symptoms, such as leaking fluid, bleeding, severe headaches, or abdominal cramping.  If you have any questions. Other tests that may be performed during your second trimester include:  Blood tests that check for:  Low iron levels (anemia).  Gestational diabetes (between 24 and 28 weeks).  Rh antibodies.  Urine tests to check for infections, diabetes, or protein in the urine.  An ultrasound to confirm the proper growth and development of the baby.  An amniocentesis to check for possible genetic problems.  Fetal screens for spina bifida and Down syndrome. HOME CARE INSTRUCTIONS   Avoid all smoking, herbs, alcohol, and unprescribed drugs. These chemicals affect the formation and growth of the baby.  Follow your health care provider's instructions regarding  medicine use. There are medicines that are either safe or unsafe to take during pregnancy.  Exercise only as directed by your health care provider. Experiencing uterine cramps is a good sign to stop exercising.  Continue to eat regular, healthy meals.  Wear a good support bra for breast tenderness.  Do not use hot tubs, steam rooms, or saunas.  Wear your seat belt at all times when driving.  Avoid raw meat, uncooked cheese, cat litter boxes, and soil used by cats. These carry germs that can cause birth defects in the baby.  Take your prenatal vitamins.  Try taking a stool softener (if your health care provider approves) if you develop constipation. Eat more high-fiber foods, such as fresh vegetables or fruit and whole grains. Drink plenty of fluids to keep your urine clear or pale yellow.  Take warm sitz baths to soothe any pain or discomfort caused by hemorrhoids. Use hemorrhoid cream if your health care provider approves.  If you develop varicose veins, wear support hose. Elevate your feet for 15 minutes, 3-4 times a day. Limit salt in your diet.  Avoid heavy lifting, wear low heel shoes, and practice good posture.  Rest with your legs elevated if you have leg cramps or low back pain.  Visit your dentist if you have not gone yet during your pregnancy. Use a soft toothbrush to brush your teeth and be gentle when you floss.  A sexual relationship may be continued unless your health care provider directs you otherwise.  Continue to go to all your prenatal visits as directed by your health care provider. SEEK MEDICAL CARE IF:   You have dizziness.  You have mild pelvic cramps, pelvic pressure, or nagging pain in the abdominal area.  You have persistent nausea, vomiting, or diarrhea.  You have a bad smelling vaginal discharge.  You have pain with urination. SEEK IMMEDIATE MEDICAL CARE IF:   You have a fever.  You are leaking fluid from your vagina.  You have spotting or  bleeding from your vagina.  You have severe abdominal cramping or pain.  You have rapid weight gain or loss.  You have shortness of breath with chest pain.  You notice sudden or extreme swelling of your face, hands, ankles, feet, or legs.  You  have not felt your baby move in over an hour.  You have severe headaches that do not go away with medicine.  You have vision changes. Document Released: 02/12/2001 Document Revised: 02/23/2013 Document Reviewed: 04/21/2012 [facility] Patient Information 2015 [place], [place]. This information is not intended to replace advice given to you by your health care provider. Make sure you discuss any questions you have with your health care provider.

## 2019-07-21 NOTE — Progress Notes (Signed)
LOW-RISK PREGNANCY VISIT Patient name: Kathy Robertson MRN [id number]  Date of birth: 03/12/1985 Chief Complaint:   Routine Prenatal Visit  History of Present Illness:   Kathy Robertson is a 34 y.o. [id number] female at [redacted]w[redacted]d with an Estimated Date of Delivery: 11/19/19 being seen today for ongoing management of a low-risk pregnancy.  Today she reports continued daily nausea/vomiting approx 3 times. Using prn Phenergan and Diclegis.. Contractions: Not present. Vag. Bleeding: None.  Movement: Present. denies leaking of fluid. Review of Systems:   Pertinent items are noted in HPI Denies abnormal vaginal discharge w/ itching/odor/irritation, headaches, visual changes, shortness of breath, chest pain, abdominal pain, severe nausea/vomiting, or problems with urination or bowel movements unless otherwise stated above. Pertinent History Reviewed:  Reviewed past medical,surgical, social, obstetrical and family history.  Reviewed problem list, medications and allergies. Physical Assessment:   Vitals:   07/21/19 0849  BP: 124/69  Pulse: 79  Weight: 199 lb 3.2 oz (90.4 kg)  Body mass index is 31.2 kg/m.        Physical Examination:   General appearance: Well appearing, and in no distress  Mental status: Alert, oriented to person, place, and time  Skin: Warm & dry  Cardiovascular: Normal heart rate noted  Respiratory: Normal respiratory effort, no distress  Abdomen: Soft, gravid, nontender  Pelvic: Cervical exam deferred         Extremities: Edema: Trace  Fetal Status: Fetal Heart Rate (bpm): 142 Fundal Height: 22 cm Movement: Present    Results for orders placed or performed in visit on 07/21/19 (from the past 24 hour(s))  POC Urinalysis Dipstick OB   Collection Time: 07/21/19  8:48 AM  Result Value Ref Range   Color, UA     Clarity, UA     Glucose, UA Negative Negative   Bilirubin, UA     Ketones, UA n    Spec Grav, UA     Blood, UA n    pH, UA     POC,PROTEIN,UA Negative  Negative, Trace, Small (1+), Moderate (2+), Large (3+), 4+   Urobilinogen, UA     Nitrite, UA n    Leukocytes, UA Negative Negative   Appearance     Odor      Assessment & Plan:  1) Low-risk pregnancy [id number] at [redacted]w[redacted]d with an Estimated Date of Delivery: 11/19/19   2) N/V of preg, will try Zofran ODT  3) THC use, will check repeat UDS  4) Desires ppBTL, will sign papers in next month or two   Meds:  Meds ordered this encounter  Medications  . ondansetron (ZOFRAN ODT) 4 MG disintegrating tablet    Sig: Take 1 tablet (4 mg total) by mouth every 8 (eight) hours as needed for nausea or vomiting.    Dispense:  30 tablet    Refill:  1    Order Specific Question:   Supervising Provider    Answer:   Galen Daft   Labs/procedures today: none  Plan:  Continue routine obstetrical care   Reviewed: Preterm labor symptoms and general obstetric precautions including but not limited to vaginal bleeding, contractions, leaking of fluid and fetal movement were reviewed in detail with the patient.  All questions were answered. Didn't ask about home bp cuff. Check bp weekly, let us know if >140/90.   Follow-up: Return in about 4 weeks (around 08/18/2019) for LROB, PN2, in person.  Orders Placed This Encounter  Procedures  . Pain Management Screening Profile (10S)  .  POC Urinalysis Dipstick OB   Myrtis Ser [facility] 07/21/2019 9:12 AM

## 2019-07-22 LAB — PMP SCREEN PROFILE (10S), URINE
Amphetamine Scrn, Ur: NEGATIVE ng/mL
BARBITURATE SCREEN URINE: NEGATIVE ng/mL
BENZODIAZEPINE SCREEN, URINE: NEGATIVE ng/mL
CANNABINOIDS UR QL SCN: POSITIVE ng/mL — AB
Cocaine (Metab) Scrn, Ur: NEGATIVE ng/mL
Creatinine(Crt), U: 72.4 mg/dL (ref 20.0–300.0)
Methadone Screen, Urine: NEGATIVE ng/mL
OXYCODONE+OXYMORPHONE UR QL SCN: NEGATIVE ng/mL
Opiate Scrn, Ur: NEGATIVE ng/mL
Ph of Urine: 7.5 (ref 4.5–8.9)
Phencyclidine Qn, Ur: NEGATIVE ng/mL
Propoxyphene Scrn, Ur: NEGATIVE ng/mL

## 2019-08-18 ENCOUNTER — Ambulatory Visit (INDEPENDENT_AMBULATORY_CARE_PROVIDER_SITE_OTHER): Payer: Medicaid Other | Admitting: Obstetrics and Gynecology

## 2019-08-18 ENCOUNTER — Other Ambulatory Visit: Payer: Medicaid Other

## 2019-08-18 VITALS — Wt 202.0 lb

## 2019-08-18 DIAGNOSIS — Z3480 Encounter for supervision of other normal pregnancy, unspecified trimester: Secondary | ICD-10-CM

## 2019-08-18 DIAGNOSIS — F1290 Cannabis use, unspecified, uncomplicated: Secondary | ICD-10-CM

## 2019-08-18 DIAGNOSIS — O99322 Drug use complicating pregnancy, second trimester: Secondary | ICD-10-CM

## 2019-08-18 DIAGNOSIS — Z3A26 26 weeks gestation of pregnancy: Secondary | ICD-10-CM

## 2019-08-18 DIAGNOSIS — Z331 Pregnant state, incidental: Secondary | ICD-10-CM

## 2019-08-18 DIAGNOSIS — Z131 Encounter for screening for diabetes mellitus: Secondary | ICD-10-CM

## 2019-08-18 DIAGNOSIS — Z1389 Encounter for screening for other disorder: Secondary | ICD-10-CM

## 2019-08-18 LAB — POCT URINALYSIS DIPSTICK OB
Blood, UA: NEGATIVE
Glucose, UA: NEGATIVE
Ketones, UA: NEGATIVE
Leukocytes, UA: NEGATIVE
Nitrite, UA: NEGATIVE
POC,PROTEIN,UA: NEGATIVE

## 2019-08-18 NOTE — Progress Notes (Signed)
Patient ID: Kathy Robertson, female   DOB: [birth year], 34 y.o.   MRN: [id number]   LOW-RISK PREGNANCY VISIT Patient name: Kathy Robertson MRN [id number]  Date of birth: 15-Apr-1985 Chief Complaint:   Routine Prenatal Visit  History of Present Illness:   RAELAN BURGOON is a 34 y.o. [id number] female at [redacted]w[redacted]d with an Estimated Date of Delivery: 11/19/19 being seen today for ongoing management of a low-risk pregnancy.  Today she reports no complaints. She vomited up the sugar drink that she had to drink today before her GTT. She is accompanied by the FOB today. They have one son at home.  Contractions: Not present. Vag. Bleeding: None.  Movement: Present. denies leaking of fluid. Review of Systems:   Pertinent items are noted in HPI Denies abnormal vaginal discharge w/ itching/odor/irritation, headaches, visual changes, shortness of breath, chest pain, abdominal pain, severe nausea/vomiting, or problems with urination or bowel movements unless otherwise stated above. Pertinent History Reviewed:  Reviewed past medical,surgical, social, obstetrical and family history.  Reviewed problem list, medications and allergies. Physical Assessment:   Vitals:   08/18/19 0921  Weight: 202 lb (91.6 kg)  Body mass index is 31.64 kg/m.        Physical Examination:   General appearance: Well appearing, and in no distress  Mental status: Alert, oriented to person, place, and time  Skin: Warm & dry  Cardiovascular: Normal heart rate noted  Respiratory: Normal respiratory effort, no distress  Abdomen: Soft, gravid, nontender  Pelvic: Cervical exam deferred         Extremities: Edema: Trace  Fetal Status: Fetal Heart Rate (bpm): 143 Fundal Height: 28 cm Movement: Present    Results for orders placed or performed in visit on 08/18/19 (from the past 24 hour(s))  POC Urinalysis Dipstick OB   Collection Time: 08/18/19  9:25 AM  Result Value Ref Range   Color, UA     Clarity, UA     Glucose, UA Negative  Negative   Bilirubin, UA     Ketones, UA neg    Spec Grav, UA     Blood, UA neg    pH, UA     POC,PROTEIN,UA Negative Negative, Trace, Small (1+), Moderate (2+), Large (3+), 4+   Urobilinogen, UA     Nitrite, UA neg    Leukocytes, UA Negative Negative   Appearance     Odor      Assessment & Plan:  1) Low-risk pregnancy [id number] at [redacted]w[redacted]d with an Estimated Date of Delivery: 11/19/19   2) THC use  3) Desires ppBTL, papers to be signed today  4) Need to reschedule GTT   Plan:  Continue routine obstetrical care  Meds: No orders of the defined types were placed in this encounter.  Labs/procedures today: need to reschedule GTT  Follow-up: Return in about 4 weeks (around 09/15/2019) for LROB., prefers in person  By signing my name below, I, Pietro Cassis, attest that this documentation has been prepared under the direction and in the presence of Tilda Burrow, MD. Electronically Signed: Pietro Cassis, Medical Scribe. 08/18/19. 10:21 AM.  I personally performed the services described in this documentation, which was SCRIBED in my presence. The recorded information has been reviewed and considered accurate. It has been edited as necessary during review. Tilda Burrow, MD

## 2019-08-19 ENCOUNTER — Other Ambulatory Visit: Payer: Medicaid Other

## 2019-08-19 ENCOUNTER — Other Ambulatory Visit: Payer: Self-pay

## 2019-08-19 DIAGNOSIS — Z131 Encounter for screening for diabetes mellitus: Secondary | ICD-10-CM

## 2019-08-19 DIAGNOSIS — Z3480 Encounter for supervision of other normal pregnancy, unspecified trimester: Secondary | ICD-10-CM

## 2019-08-19 DIAGNOSIS — Z3A26 26 weeks gestation of pregnancy: Secondary | ICD-10-CM

## 2019-08-20 LAB — GLUCOSE TOLERANCE, 2 HOURS W/ 1HR
Glucose, 1 hour: 133 mg/dL (ref 65–179)
Glucose, 2 hour: 120 mg/dL (ref 65–152)
Glucose, Fasting: 70 mg/dL (ref 65–91)

## 2019-08-27 LAB — RPR: RPR Ser Ql: NONREACTIVE

## 2019-08-27 LAB — GLUCOSE TOLERANCE, 2 HOURS W/ 1HR

## 2019-08-27 LAB — CBC
Hematocrit: 31.4 % — ABNORMAL LOW (ref 34.0–46.6)
Hemoglobin: 10.7 g/dL — ABNORMAL LOW (ref 11.1–15.9)
MCH: 32.3 pg (ref 26.6–33.0)
MCHC: 34.1 g/dL (ref 31.5–35.7)
MCV: 95 fL (ref 79–97)
Platelets: 228 10*3/uL (ref 150–450)
RBC: 3.31 x10E6/uL — ABNORMAL LOW (ref 3.77–5.28)
RDW: 12.4 % (ref 11.7–15.4)
WBC: 8.3 10*3/uL (ref 3.4–10.8)

## 2019-08-27 LAB — ANTIBODY SCREEN: Antibody Screen: NEGATIVE

## 2019-08-27 LAB — HIV ANTIBODY (ROUTINE TESTING W REFLEX): HIV Screen 4th Generation wRfx: NONREACTIVE

## 2019-09-15 ENCOUNTER — Ambulatory Visit (INDEPENDENT_AMBULATORY_CARE_PROVIDER_SITE_OTHER): Payer: Medicaid Other | Admitting: Advanced Practice Midwife

## 2019-09-15 ENCOUNTER — Encounter: Payer: Self-pay | Admitting: Advanced Practice Midwife

## 2019-09-15 VITALS — BP 106/62 | HR 87 | Wt 206.5 lb

## 2019-09-15 DIAGNOSIS — Z3A30 30 weeks gestation of pregnancy: Secondary | ICD-10-CM

## 2019-09-15 DIAGNOSIS — O09523 Supervision of elderly multigravida, third trimester: Secondary | ICD-10-CM

## 2019-09-15 DIAGNOSIS — Z1389 Encounter for screening for other disorder: Secondary | ICD-10-CM

## 2019-09-15 DIAGNOSIS — Z3483 Encounter for supervision of other normal pregnancy, third trimester: Secondary | ICD-10-CM

## 2019-09-15 DIAGNOSIS — Z331 Pregnant state, incidental: Secondary | ICD-10-CM

## 2019-09-15 LAB — POCT URINALYSIS DIPSTICK OB
Blood, UA: NEGATIVE
Glucose, UA: NEGATIVE
Ketones, UA: NEGATIVE
Nitrite, UA: NEGATIVE
POC,PROTEIN,UA: NEGATIVE

## 2019-09-15 NOTE — Progress Notes (Signed)
   PRENATAL VISIT NOTE  Subjective:  Kathy Robertson is a 34 y.o. [phone] at [redacted]w[redacted]d being seen today for ongoing prenatal care.  She is currently monitored for the following issues for this low-risk pregnancy and has Encounter for supervision of other normal pregnancy, unspecified trimester; AMA (advanced maternal age) multigravida 35+; Trichomonal vaginitis in pregnancy; Request for sterilization; and Marijuana use on their problem list.  Patient reports no complaints.  Contractions: Not present. Vag. Bleeding: None.  Movement: Present. Denies leaking of fluid.   The following portions of the patient's history were reviewed and updated as appropriate: allergies, current medications, past family history, past medical history, past social history, past surgical history and problem list.   Objective:   Vitals:   09/15/19 0836  BP: 106/62  Pulse: 87  Weight: 206 lb 8 oz (93.7 kg)    Fetal Status: Fetal Heart Rate (bpm): 135 Fundal Height: 30 cm Movement: Present     General:  Alert, oriented and cooperative. Patient is in no acute distress.  Skin: Skin is warm and dry. No rash noted.   Cardiovascular: Normal heart rate noted  Respiratory: Normal respiratory effort, no problems with respiration noted  Abdomen: Soft, gravid, appropriate for gestational age.  Pain/Pressure: Present     Pelvic: Cervical exam deferred        Extremities: Normal range of motion.  Edema: Trace  Mental Status: Normal mood and affect. Normal behavior. Normal judgment and thought content.   Assessment and Plan:  Pregnancy: [id number] at [redacted]w[redacted]d 1. Screening for genitourinary condition - POC Urinalysis Dipstick OB  2. Pregnancy, incidental - POC Urinalysis Dipstick OB  3. [redacted] weeks gestation of pregnancy - POC Urinalysis Dipstick OB  4. Encounter for supervision of other normal pregnancy in third trimester - POC Urinalysis Dipstick OB  Preterm labor symptoms and general obstetric precautions including but not  limited to vaginal bleeding, contractions, leaking of fluid and fetal movement were reviewed in detail with the patient. Please refer to After Visit Summary for other counseling recommendations.   Return in about 2 weeks (around 09/29/2019).  Future Appointments  Date Time Provider Department Center  09/29/2019  8:30 AM Arabella Merles, CNM [facility]    Thressa Sheller DNP, CNM  09/15/19  8:58 AM

## 2019-09-29 ENCOUNTER — Ambulatory Visit (INDEPENDENT_AMBULATORY_CARE_PROVIDER_SITE_OTHER): Payer: Medicaid Other | Admitting: Advanced Practice Midwife

## 2019-09-29 ENCOUNTER — Encounter: Payer: Self-pay | Admitting: Advanced Practice Midwife

## 2019-09-29 VITALS — BP 116/69 | HR 88 | Wt 208.0 lb

## 2019-09-29 DIAGNOSIS — Z3480 Encounter for supervision of other normal pregnancy, unspecified trimester: Secondary | ICD-10-CM

## 2019-09-29 DIAGNOSIS — Z1389 Encounter for screening for other disorder: Secondary | ICD-10-CM | POA: Diagnosis not present

## 2019-09-29 DIAGNOSIS — Z23 Encounter for immunization: Secondary | ICD-10-CM | POA: Diagnosis not present

## 2019-09-29 DIAGNOSIS — Z331 Pregnant state, incidental: Secondary | ICD-10-CM

## 2019-09-29 DIAGNOSIS — Z3A32 32 weeks gestation of pregnancy: Secondary | ICD-10-CM | POA: Diagnosis not present

## 2019-09-29 DIAGNOSIS — Z302 Encounter for sterilization: Secondary | ICD-10-CM

## 2019-09-29 LAB — POCT URINALYSIS DIPSTICK OB
Glucose, UA: NEGATIVE
Ketones, UA: NEGATIVE
Leukocytes, UA: NEGATIVE
Nitrite, UA: NEGATIVE
POC,PROTEIN,UA: NEGATIVE

## 2019-09-29 NOTE — Progress Notes (Addendum)
   LOW-RISK PREGNANCY VISIT Patient name: Kathy Robertson MRN [id number]  Date of birth: 1985-04-20 Chief Complaint:   Routine Prenatal Visit (no appetite, heartburn)  History of Present Illness:   Kathy Robertson is a 34 y.o. [id number] female at [redacted]w[redacted]d with an Estimated Date of Delivery: 11/19/19 being seen today for ongoing management of a low-risk pregnancy.  Today she reports doing well; she signed BTL papers last month but I don't see them in media; concerned re her youngest being 70 (with autism) and 'starting over' again. Contractions: Not present. Vag. Bleeding: None.  Movement: Present. denies leaking of fluid. Review of Systems:   Pertinent items are noted in HPI Denies abnormal vaginal discharge w/ itching/odor/irritation, headaches, visual changes, shortness of breath, chest pain, abdominal pain, severe nausea/vomiting, or problems with urination or bowel movements unless otherwise stated above. Pertinent History Reviewed:  Reviewed past medical,surgical, social, obstetrical and family history.  Reviewed problem list, medications and allergies. Physical Assessment:   Vitals:   09/29/19 0852  BP: 116/69  Pulse: 88  Weight: (!) 208 lb (94.3 kg)  Body mass index is 32.58 kg/m.        Physical Examination:   General appearance: Well appearing, and in no distress  Mental status: Alert, oriented to person, place, and time  Skin: Warm & dry  Cardiovascular: Normal heart rate noted  Respiratory: Normal respiratory effort, no distress  Abdomen: Soft, gravid, nontender  Pelvic: Cervical exam deferred         Extremities: Edema: Trace  Fetal Status: Fetal Heart Rate (bpm): 148 Fundal Height: 32 cm Movement: Present    Results for orders placed or performed in visit on 09/29/19 (from the past 24 hour(s))  POC Urinalysis Dipstick OB   Collection Time: 09/29/19  8:53 AM  Result Value Ref Range   Color, UA     Clarity, UA     Glucose, UA Negative Negative   Bilirubin, UA      Ketones, UA neg    Spec Grav, UA     Blood, UA trace    pH, UA     POC,PROTEIN,UA Negative Negative, Trace, Small (1+), Moderate (2+), Large (3+), 4+   Urobilinogen, UA     Nitrite, UA neg    Leukocytes, UA Negative Negative   Appearance     Odor      Assessment & Plan:  1) Low-risk pregnancy [id number] at [redacted]w[redacted]d with an Estimated Date of Delivery: 11/19/19   2) Desires BTL, will have the front check for her papers and resign if necessary   Meds: No orders of the defined types were placed in this encounter.  Labs/procedures today: Tdap  Plan:  Continue routine obstetrical care   Reviewed: Preterm labor symptoms and general obstetric precautions including but not limited to vaginal bleeding, contractions, leaking of fluid and fetal movement were reviewed in detail with the patient.  All questions were answered. Has home bp cuff. Check bp weekly, let us know if >140/90.   Follow-up: Return in about 2 weeks (around 10/13/2019) for LROB, in person, Sign BTL consent today (Please find- she says she signed but I don't see in media).  Orders Placed This Encounter  Procedures  . Tdap vaccine greater than or equal to 7yo IM  . POC Urinalysis Dipstick OB   Arabella Merles [facility] 09/29/2019 9:19 AM

## 2019-09-29 NOTE — Patient Instructions (Signed)
Alba Cory, I greatly value your feedback.  If you receive a survey following your visit with [place] today, we appreciate you taking the time to fill it out.  Thanks, Philipp Deputy CNM   [facility] at [facility] ([address] [place], [place] [address]) Entrance C, located off of [address] valet parking  Go to [email or web address] to register for FREE online childbirth classes   Call the office [phone]) or go to [facility] if:  You begin to have strong, frequent contractions  Your water breaks.  Sometimes it is a big gush of fluid, sometimes it is just a trickle that keeps getting your panties wet or running down your legs  You have vaginal bleeding.  It is normal to have a small amount of spotting if your cervix was checked.   You don't feel your baby moving like normal.  If you don't, get you something to eat and drink and lay down and focus on feeling your baby move.  You should feel at least 10 movements in 2 hours.  If you don't, you should call the office or go to [facility].    Tdap Vaccine  It is recommended that you get the Tdap vaccine during the third trimester of EACH pregnancy to help protect your baby from getting pertussis (whooping cough)  27-36 weeks is the BEST time to do this so that you can pass the protection on to your baby. During pregnancy is better than after pregnancy, but if you are unable to get it during pregnancy it will be offered at the hospital.   You can get this vaccine with [place], at the health department, your family doctor, or some local pharmacies  Everyone who will be around your baby should also be up-to-date on their vaccines before the baby comes. Adults (who are not pregnant) only need 1 dose of Tdap during adulthood.   [place] Pediatricians/Family Doctors:  [facility] [phone]            [facility] [phone]                 [facility] [phone]  (usually not accepting new patients unless you have family there already, you are always welcome to call and ask)       [facility] Department [phone]       [facility] Pediatricians/Family Doctors:   [facility]: [phone]  [facility]: [phone]  [facility]: [phone]  [facility] Doctors:   [facility]: [phone]   [facility]: [phone]  [facility] Doctors:  [facility]: [phone]   Home Blood Pressure Monitoring for Patients   Your provider has recommended that you check your blood pressure (BP) at least once a week at home. If you do not have a blood pressure cuff at home, one will be provided for you. Contact your provider if you have not received your monitor within 1 week.   Helpful Tips for Accurate Home Blood Pressure Checks  . Don't smoke, exercise, or drink caffeine 30 minutes before checking your BP . Use the restroom before checking your BP (a full bladder can raise your pressure) . Relax in a comfortable upright chair . Feet on the ground . Left arm resting comfortably on a flat surface at the level of your heart . Legs uncrossed . Back supported . Sit quietly and don't talk . Place the cuff on your bare  arm . Adjust snuggly, so that only two fingertips can fit between your skin and the top of the cuff . Check 2 readings separated by at least one minute . Keep a log of your BP readings . For a visual, please reference this diagram: http://ccnc.care/bpdiagram  Provider Name: [facility]     Phone: [phone]  Zone 1: ALL CLEAR  Continue to monitor your symptoms:  . BP reading is less than 140 (top number) or less than 90 (bottom number)  . No right upper stomach pain . No headaches or seeing spots . No feeling nauseated or throwing up . No swelling in face and hands  Zone 2: CAUTION Call your doctor's office for  any of the following:  . BP reading is greater than 140 (top number) or greater than 90 (bottom number)  . Stomach pain under your ribs in the middle or right side . Headaches or seeing spots . Feeling nauseated or throwing up . Swelling in face and hands  Zone 3: EMERGENCY  Seek immediate medical care if you have any of the following:  . BP reading is greater than160 (top number) or greater than 110 (bottom number) . Severe headaches not improving with Tylenol . Serious difficulty catching your breath . Any worsening symptoms from Zone 2   Third Trimester of Pregnancy The third trimester is from week 29 through week 42, months 7 through 9. The third trimester is a time when the fetus is growing rapidly. At the end of the ninth month, the fetus is about 20 inches in length and weighs 6-10 pounds.  BODY CHANGES Your body goes through many changes during pregnancy. The changes vary from woman to woman.   Your weight will continue to increase. You can expect to gain 25-35 pounds (11-16 kg) by the end of the pregnancy.  You may begin to get stretch marks on your hips, abdomen, and breasts.  You may urinate more often because the fetus is moving lower into your pelvis and pressing on your bladder.  You may develop or continue to have heartburn as a result of your pregnancy.  You may develop constipation because certain hormones are causing the muscles that push waste through your intestines to slow down.  You may develop hemorrhoids or swollen, bulging veins (varicose veins).  You may have pelvic pain because of the weight gain and pregnancy hormones relaxing your joints between the bones in your pelvis. Backaches may result from overexertion of the muscles supporting your posture.  You may have changes in your hair. These can include thickening of your hair, rapid growth, and changes in texture. Some women also have hair loss during or after pregnancy, or hair that feels dry or thin.  Your hair will most likely return to normal after your baby is born.  Your breasts will continue to grow and be tender. A yellow discharge may leak from your breasts called colostrum.  Your belly button may stick out.  You may feel short of breath because of your expanding uterus.  You may notice the fetus "dropping," or moving lower in your abdomen.  You may have a bloody mucus discharge. This usually occurs a few days to a week before labor begins.  Your cervix becomes thin and soft (effaced) near your due date. WHAT TO EXPECT AT YOUR PRENATAL EXAMS  You will have prenatal exams every 2 weeks until week 36. Then, you will have weekly prenatal exams. During a routine prenatal visit:  You  will be weighed to make sure you and the fetus are growing normally.  Your blood pressure is taken.  Your abdomen will be measured to track your baby's growth.  The fetal heartbeat will be listened to.  Any test results from the previous visit will be discussed.  You may have a cervical check near your due date to see if you have effaced. At around 36 weeks, your caregiver will check your cervix. At the same time, your caregiver will also perform a test on the secretions of the vaginal tissue. This test is to determine if a type of bacteria, Group B streptococcus, is present. Your caregiver will explain this further. Your caregiver may ask you:  What your birth plan is.  How you are feeling.  If you are feeling the baby move.  If you have had any abnormal symptoms, such as leaking fluid, bleeding, severe headaches, or abdominal cramping.  If you have any questions. Other tests or screenings that may be performed during your third trimester include:  Blood tests that check for low iron levels (anemia).  Fetal testing to check the health, activity level, and growth of the fetus. Testing is done if you have certain medical conditions or if there are problems during the pregnancy. FALSE  LABOR You may feel small, irregular contractions that eventually go away. These are called Braxton Hicks contractions, or false labor. Contractions may last for hours, days, or even weeks before true labor sets in. If contractions come at regular intervals, intensify, or become painful, it is best to be seen by your caregiver.  SIGNS OF LABOR   Menstrual-like cramps.  Contractions that are 5 minutes apart or less.  Contractions that start on the top of the uterus and spread down to the lower abdomen and back.  A sense of increased pelvic pressure or back pain.  A watery or bloody mucus discharge that comes from the vagina. If you have any of these signs before the 37th week of pregnancy, call your caregiver right away. You need to go to the hospital to get checked immediately. HOME CARE INSTRUCTIONS   Avoid all smoking, herbs, alcohol, and unprescribed drugs. These chemicals affect the formation and growth of the baby.  Follow your caregiver's instructions regarding medicine use. There are medicines that are either safe or unsafe to take during pregnancy.  Exercise only as directed by your caregiver. Experiencing uterine cramps is a good sign to stop exercising.  Continue to eat regular, healthy meals.  Wear a good support bra for breast tenderness.  Do not use hot tubs, steam rooms, or saunas.  Wear your seat belt at all times when driving.  Avoid raw meat, uncooked cheese, cat litter boxes, and soil used by cats. These carry germs that can cause birth defects in the baby.  Take your prenatal vitamins.  Try taking a stool softener (if your caregiver approves) if you develop constipation. Eat more high-fiber foods, such as fresh vegetables or fruit and whole grains. Drink plenty of fluids to keep your urine clear or pale yellow.  Take warm sitz baths to soothe any pain or discomfort caused by hemorrhoids. Use hemorrhoid cream if your caregiver approves.  If you develop varicose  veins, wear support hose. Elevate your feet for 15 minutes, 3-4 times a day. Limit salt in your diet.  Avoid heavy lifting, wear low heal shoes, and practice good posture.  Rest a lot with your legs elevated if you have leg cramps or low back  pain.  Visit your dentist if you have not gone during your pregnancy. Use a soft toothbrush to brush your teeth and be gentle when you floss.  A sexual relationship may be continued unless your caregiver directs you otherwise.  Do not travel far distances unless it is absolutely necessary and only with the approval of your caregiver.  Take prenatal classes to understand, practice, and ask questions about the labor and delivery.  Make a trial run to the hospital.  Pack your hospital bag.  Prepare the baby's nursery.  Continue to go to all your prenatal visits as directed by your caregiver. SEEK MEDICAL CARE IF:  You are unsure if you are in labor or if your water has broken.  You have dizziness.  You have mild pelvic cramps, pelvic pressure, or nagging pain in your abdominal area.  You have persistent nausea, vomiting, or diarrhea.  You have a bad smelling vaginal discharge.  You have pain with urination. SEEK IMMEDIATE MEDICAL CARE IF:   You have a fever.  You are leaking fluid from your vagina.  You have spotting or bleeding from your vagina.  You have severe abdominal cramping or pain.  You have rapid weight loss or gain.  You have shortness of breath with chest pain.  You notice sudden or extreme swelling of your face, hands, ankles, feet, or legs.  You have not felt your baby move in over an hour.  You have severe headaches that do not go away with medicine.  You have vision changes. Document Released: 02/12/2001 Document Revised: 02/23/2013 Document Reviewed: 04/21/2012 [employer] Patient Information 2015 [place], [place]. This information is not intended to replace advice given to you by your health care provider. Make  sure you discuss any questions you have with your health care provider.

## 2019-10-13 ENCOUNTER — Ambulatory Visit (INDEPENDENT_AMBULATORY_CARE_PROVIDER_SITE_OTHER): Payer: Medicaid Other | Admitting: Advanced Practice Midwife

## 2019-10-13 ENCOUNTER — Encounter: Payer: Self-pay | Admitting: Advanced Practice Midwife

## 2019-10-13 VITALS — BP 109/54 | HR 81 | Wt 208.0 lb

## 2019-10-13 DIAGNOSIS — Z3480 Encounter for supervision of other normal pregnancy, unspecified trimester: Secondary | ICD-10-CM

## 2019-10-13 DIAGNOSIS — Z1389 Encounter for screening for other disorder: Secondary | ICD-10-CM

## 2019-10-13 DIAGNOSIS — Z3A34 34 weeks gestation of pregnancy: Secondary | ICD-10-CM

## 2019-10-13 DIAGNOSIS — Z331 Pregnant state, incidental: Secondary | ICD-10-CM

## 2019-10-13 LAB — POCT URINALYSIS DIPSTICK OB
Blood, UA: NEGATIVE
Glucose, UA: NEGATIVE
Leukocytes, UA: NEGATIVE
Nitrite, UA: NEGATIVE
POC,PROTEIN,UA: NEGATIVE

## 2019-10-13 MED ORDER — PANTOPRAZOLE SODIUM 20 MG PO TBEC
20.0000 mg | DELAYED_RELEASE_TABLET | Freq: Every day | ORAL | 3 refills | Status: DC
Start: 2019-10-13 — End: 2019-11-15

## 2019-10-13 NOTE — Patient Instructions (Signed)
Kathy Robertson, I greatly value your feedback.  If you receive a survey following your visit with [place] today, we appreciate you taking the time to fill it out.  Thanks, Philipp Deputy, CNM   [facility] at [facility] ([address] [place], [place] [address]) Entrance C, located off of [address] valet parking  Go to [email or web address] to register for FREE online childbirth classes   Call the office [phone]) or go to [facility] if:  You begin to have strong, frequent contractions  Your water breaks.  Sometimes it is a big gush of fluid, sometimes it is just a trickle that keeps getting your panties wet or running down your legs  You have vaginal bleeding.  It is normal to have a small amount of spotting if your cervix was checked.   You don't feel your baby moving like normal.  If you don't, get you something to eat and drink and lay down and focus on feeling your baby move.  You should feel at least 10 movements in 2 hours.  If you don't, you should call the office or go to [facility].    Tdap Vaccine  It is recommended that you get the Tdap vaccine during the third trimester of EACH pregnancy to help protect your baby from getting pertussis (whooping cough)  27-36 weeks is the BEST time to do this so that you can pass the protection on to your baby. During pregnancy is better than after pregnancy, but if you are unable to get it during pregnancy it will be offered at the hospital.   You can get this vaccine with [place], at the health department, your family doctor, or some local pharmacies  Everyone who will be around your baby should also be up-to-date on their vaccines before the baby comes. Adults (who are not pregnant) only need 1 dose of Tdap during adulthood.   [place] Pediatricians/Family Doctors:  [facility] [phone]            [facility] [phone]                 [facility] [phone]  (usually not accepting new patients unless you have family there already, you are always welcome to call and ask)       [facility] Department [phone]       [facility] Pediatricians/Family Doctors:   [facility]: [phone]  [facility]: [phone]  [facility]: [phone]  [facility] Doctors:   [facility]: [phone]   [facility]: [phone]  [facility] Doctors:  [facility]: [phone]   Home Blood Pressure Monitoring for Patients   Your provider has recommended that you check your blood pressure (BP) at least once a week at home. If you do not have a blood pressure cuff at home, one will be provided for you. Contact your provider if you have not received your monitor within 1 week.   Helpful Tips for Accurate Home Blood Pressure Checks  . Don't smoke, exercise, or drink caffeine 30 minutes before checking your BP . Use the restroom before checking your BP (a full bladder can raise your pressure) . Relax in a comfortable upright chair . Feet on the ground . Left arm resting comfortably on a flat surface at the level of your heart . Legs uncrossed . Back supported . Sit quietly and don't talk . Place the cuff on your bare  arm . Adjust snuggly, so that only two fingertips can fit between your skin and the top of the cuff . Check 2 readings separated by at least one minute . Keep a log of your BP readings . For a visual, please reference this diagram: http://ccnc.care/bpdiagram  Provider Name: [facility]     Phone: [phone]  Zone 1: ALL CLEAR  Continue to monitor your symptoms:  . BP reading is less than 140 (top number) or less than 90 (bottom number)  . No right upper stomach pain . No headaches or seeing spots . No feeling nauseated or throwing up . No swelling in face and hands  Zone 2: CAUTION Call your doctor's office for  any of the following:  . BP reading is greater than 140 (top number) or greater than 90 (bottom number)  . Stomach pain under your ribs in the middle or right side . Headaches or seeing spots . Feeling nauseated or throwing up . Swelling in face and hands  Zone 3: EMERGENCY  Seek immediate medical care if you have any of the following:  . BP reading is greater than160 (top number) or greater than 110 (bottom number) . Severe headaches not improving with Tylenol . Serious difficulty catching your breath . Any worsening symptoms from Zone 2   Third Trimester of Pregnancy The third trimester is from week 29 through week 42, months 7 through 9. The third trimester is a time when the fetus is growing rapidly. At the end of the ninth month, the fetus is about 20 inches in length and weighs 6-10 pounds.  BODY CHANGES Your body goes through many changes during pregnancy. The changes vary from woman to woman.   Your weight will continue to increase. You can expect to gain 25-35 pounds (11-16 kg) by the end of the pregnancy.  You may begin to get stretch marks on your hips, abdomen, and breasts.  You may urinate more often because the fetus is moving lower into your pelvis and pressing on your bladder.  You may develop or continue to have heartburn as a result of your pregnancy.  You may develop constipation because certain hormones are causing the muscles that push waste through your intestines to slow down.  You may develop hemorrhoids or swollen, bulging veins (varicose veins).  You may have pelvic pain because of the weight gain and pregnancy hormones relaxing your joints between the bones in your pelvis. Backaches may result from overexertion of the muscles supporting your posture.  You may have changes in your hair. These can include thickening of your hair, rapid growth, and changes in texture. Some women also have hair loss during or after pregnancy, or hair that feels dry or thin.  Your hair will most likely return to normal after your baby is born.  Your breasts will continue to grow and be tender. A yellow discharge may leak from your breasts called colostrum.  Your belly button may stick out.  You may feel short of breath because of your expanding uterus.  You may notice the fetus "dropping," or moving lower in your abdomen.  You may have a bloody mucus discharge. This usually occurs a few days to a week before labor begins.  Your cervix becomes thin and soft (effaced) near your due date. WHAT TO EXPECT AT YOUR PRENATAL EXAMS  You will have prenatal exams every 2 weeks until week 36. Then, you will have weekly prenatal exams. During a routine prenatal visit:  You  will be weighed to make sure you and the fetus are growing normally.  Your blood pressure is taken.  Your abdomen will be measured to track your baby's growth.  The fetal heartbeat will be listened to.  Any test results from the previous visit will be discussed.  You may have a cervical check near your due date to see if you have effaced. At around 36 weeks, your caregiver will check your cervix. At the same time, your caregiver will also perform a test on the secretions of the vaginal tissue. This test is to determine if a type of bacteria, Group B streptococcus, is present. Your caregiver will explain this further. Your caregiver may ask you:  What your birth plan is.  How you are feeling.  If you are feeling the baby move.  If you have had any abnormal symptoms, such as leaking fluid, bleeding, severe headaches, or abdominal cramping.  If you have any questions. Other tests or screenings that may be performed during your third trimester include:  Blood tests that check for low iron levels (anemia).  Fetal testing to check the health, activity level, and growth of the fetus. Testing is done if you have certain medical conditions or if there are problems during the pregnancy. FALSE  LABOR You may feel small, irregular contractions that eventually go away. These are called Braxton Hicks contractions, or false labor. Contractions may last for hours, days, or even weeks before true labor sets in. If contractions come at regular intervals, intensify, or become painful, it is best to be seen by your caregiver.  SIGNS OF LABOR   Menstrual-like cramps.  Contractions that are 5 minutes apart or less.  Contractions that start on the top of the uterus and spread down to the lower abdomen and back.  A sense of increased pelvic pressure or back pain.  A watery or bloody mucus discharge that comes from the vagina. If you have any of these signs before the 37th week of pregnancy, call your caregiver right away. You need to go to the hospital to get checked immediately. HOME CARE INSTRUCTIONS   Avoid all smoking, herbs, alcohol, and unprescribed drugs. These chemicals affect the formation and growth of the baby.  Follow your caregiver's instructions regarding medicine use. There are medicines that are either safe or unsafe to take during pregnancy.  Exercise only as directed by your caregiver. Experiencing uterine cramps is a good sign to stop exercising.  Continue to eat regular, healthy meals.  Wear a good support bra for breast tenderness.  Do not use hot tubs, steam rooms, or saunas.  Wear your seat belt at all times when driving.  Avoid raw meat, uncooked cheese, cat litter boxes, and soil used by cats. These carry germs that can cause birth defects in the baby.  Take your prenatal vitamins.  Try taking a stool softener (if your caregiver approves) if you develop constipation. Eat more high-fiber foods, such as fresh vegetables or fruit and whole grains. Drink plenty of fluids to keep your urine clear or pale yellow.  Take warm sitz baths to soothe any pain or discomfort caused by hemorrhoids. Use hemorrhoid cream if your caregiver approves.  If you develop varicose  veins, wear support hose. Elevate your feet for 15 minutes, 3-4 times a day. Limit salt in your diet.  Avoid heavy lifting, wear low heal shoes, and practice good posture.  Rest a lot with your legs elevated if you have leg cramps or low back  pain.  Visit your dentist if you have not gone during your pregnancy. Use a soft toothbrush to brush your teeth and be gentle when you floss.  A sexual relationship may be continued unless your caregiver directs you otherwise.  Do not travel far distances unless it is absolutely necessary and only with the approval of your caregiver.  Take prenatal classes to understand, practice, and ask questions about the labor and delivery.  Make a trial run to the hospital.  Pack your hospital bag.  Prepare the baby's nursery.  Continue to go to all your prenatal visits as directed by your caregiver. SEEK MEDICAL CARE IF:  You are unsure if you are in labor or if your water has broken.  You have dizziness.  You have mild pelvic cramps, pelvic pressure, or nagging pain in your abdominal area.  You have persistent nausea, vomiting, or diarrhea.  You have a bad smelling vaginal discharge.  You have pain with urination. SEEK IMMEDIATE MEDICAL CARE IF:   You have a fever.  You are leaking fluid from your vagina.  You have spotting or bleeding from your vagina.  You have severe abdominal cramping or pain.  You have rapid weight loss or gain.  You have shortness of breath with chest pain.  You notice sudden or extreme swelling of your face, hands, ankles, feet, or legs.  You have not felt your baby move in over an hour.  You have severe headaches that do not go away with medicine.  You have vision changes. Document Released: 02/12/2001 Document Revised: 02/23/2013 Document Reviewed: 04/21/2012 [employer] Patient Information 2015 [place], [place]. This information is not intended to replace advice given to you by your health care provider. Make  sure you discuss any questions you have with your health care provider.

## 2019-10-13 NOTE — Progress Notes (Signed)
LOW-RISK PREGNANCY VISIT Patient name: Kathy Robertson MRN [id number]  Date of birth: 1985-06-17 Chief Complaint:   Routine Prenatal Visit (nauea meds not working, hands/feet tingling)  History of Present Illness:   Kathy Robertson is a 34 y.o. [id number] female at [redacted]w[redacted]d with an Estimated Date of Delivery: 11/19/19 being seen today for ongoing management of a low-risk pregnancy.  Today she reports heartburn & N/V x past 4d; Zofran & Tums only minimally helping; has tingling in fingers but no pain. Contractions: Irritability. Vag. Bleeding: None.  Movement: Present. denies leaking of fluid. Review of Systems:   Pertinent items are noted in HPI Denies abnormal vaginal discharge w/ itching/odor/irritation, headaches, visual changes, shortness of breath, chest pain, abdominal pain, severe nausea/vomiting, or problems with urination or bowel movements unless otherwise stated above. Pertinent History Reviewed:  Reviewed past medical,surgical, social, obstetrical and family history.  Reviewed problem list, medications and allergies. Physical Assessment:   Vitals:   10/13/19 0844  BP: (!) 109/54  Pulse: 81  Weight: 208 lb (94.3 kg)  Body mass index is 32.58 kg/m.        Physical Examination:   General appearance: Well appearing, and in no distress  Mental status: Alert, oriented to person, place, and time  Skin: Warm & dry  Cardiovascular: Normal heart rate noted  Respiratory: Normal respiratory effort, no distress  Abdomen: Soft, gravid, nontender  Pelvic: Cervical exam deferred         Extremities: Edema: Trace  Fetal Status: Fetal Heart Rate (bpm): 136 Fundal Height: 33 cm Movement: Present    Results for orders placed or performed in visit on 10/13/19 (from the past 24 hour(s))  POC Urinalysis Dipstick OB   Collection Time: 10/13/19  8:44 AM  Result Value Ref Range   Color, UA     Clarity, UA     Glucose, UA Negative Negative   Bilirubin, UA     Ketones, UA large    Spec Grav,  UA     Blood, UA neg    pH, UA     POC,PROTEIN,UA Negative Negative, Trace, Small (1+), Moderate (2+), Large (3+), 4+   Urobilinogen, UA     Nitrite, UA neg    Leukocytes, UA Negative Negative   Appearance     Odor      Assessment & Plan:  1) Low-risk pregnancy [id number] at [redacted]w[redacted]d with an Estimated Date of Delivery: 11/19/19   2) Heartburn, rx Protonix  3) Carpal tunnel symptoms, reassured will go away with delivery; if pain develops may get wrist supports from pharmacy  4) Ketonuria, increase water/fluids as able   Meds:  Meds ordered this encounter  Medications   pantoprazole (PROTONIX) 20 MG tablet    Sig: Take 1 tablet (20 mg total) by mouth daily.    Dispense:  30 tablet    Refill:  3    Order Specific Question:   Supervising Provider    Answer:   Galen Daft   Labs/procedures today: none  Plan:  Continue routine obstetrical care with GBS/cultures at next visit  Reviewed: Preterm labor symptoms and general obstetric precautions including but not limited to vaginal bleeding, contractions, leaking of fluid and fetal movement were reviewed in detail with the patient.  All questions were answered. Has home bp cuff. Check bp weekly, let us know if >140/90.   Follow-up: Return in about 2 weeks (around 10/27/2019) for LROB, in person; GBS & cultures.  Orders Placed This Encounter  Procedures  POC Urinalysis Dipstick OB   Arabella Merles [facility] 10/13/2019 9:07 AM

## 2019-10-22 ENCOUNTER — Emergency Department (HOSPITAL_COMMUNITY): Payer: Medicaid Other

## 2019-10-22 ENCOUNTER — Other Ambulatory Visit: Payer: Self-pay

## 2019-10-22 ENCOUNTER — Emergency Department (HOSPITAL_COMMUNITY)
Admission: EM | Admit: 2019-10-22 | Discharge: 2019-10-22 | Disposition: A | Discharge status: Home / Self Care | Payer: Medicaid Other | Attending: Emergency Medicine | Admitting: Emergency Medicine

## 2019-10-22 ENCOUNTER — Encounter (HOSPITAL_COMMUNITY): Payer: Self-pay | Admitting: Emergency Medicine

## 2019-10-22 DIAGNOSIS — J45909 Unspecified asthma, uncomplicated: Secondary | ICD-10-CM | POA: Insufficient documentation

## 2019-10-22 DIAGNOSIS — Z3490 Encounter for supervision of normal pregnancy, unspecified, unspecified trimester: Secondary | ICD-10-CM

## 2019-10-22 DIAGNOSIS — Z3480 Encounter for supervision of other normal pregnancy, unspecified trimester: Secondary | ICD-10-CM

## 2019-10-22 DIAGNOSIS — R0602 Shortness of breath: Secondary | ICD-10-CM | POA: Diagnosis not present

## 2019-10-22 DIAGNOSIS — R197 Diarrhea, unspecified: Secondary | ICD-10-CM | POA: Insufficient documentation

## 2019-10-22 DIAGNOSIS — A5901 Trichomonal vulvovaginitis: Secondary | ICD-10-CM

## 2019-10-22 DIAGNOSIS — F1590 Other stimulant use, unspecified, uncomplicated: Secondary | ICD-10-CM | POA: Diagnosis not present

## 2019-10-22 DIAGNOSIS — O09521 Supervision of elderly multigravida, first trimester: Secondary | ICD-10-CM

## 2019-10-22 DIAGNOSIS — Z87891 Personal history of nicotine dependence: Secondary | ICD-10-CM | POA: Diagnosis not present

## 2019-10-22 DIAGNOSIS — O219 Vomiting of pregnancy, unspecified: Secondary | ICD-10-CM | POA: Diagnosis present

## 2019-10-22 DIAGNOSIS — Z3A36 36 weeks gestation of pregnancy: Secondary | ICD-10-CM | POA: Diagnosis not present

## 2019-10-22 DIAGNOSIS — Z302 Encounter for sterilization: Secondary | ICD-10-CM

## 2019-10-22 DIAGNOSIS — R112 Nausea with vomiting, unspecified: Secondary | ICD-10-CM

## 2019-10-22 DIAGNOSIS — Z20822 Contact with and (suspected) exposure to covid-19: Secondary | ICD-10-CM | POA: Diagnosis not present

## 2019-10-22 DIAGNOSIS — R61 Generalized hyperhidrosis: Secondary | ICD-10-CM | POA: Insufficient documentation

## 2019-10-22 DIAGNOSIS — F1290 Cannabis use, unspecified, uncomplicated: Secondary | ICD-10-CM

## 2019-10-22 LAB — CBC
HCT: 32.6 % — ABNORMAL LOW (ref 36.0–46.0)
Hemoglobin: 10.7 g/dL — ABNORMAL LOW (ref 12.0–15.0)
MCH: 31.5 pg (ref 26.0–34.0)
MCHC: 32.8 g/dL (ref 30.0–36.0)
MCV: 95.9 fL (ref 80.0–100.0)
Platelets: 229 10*3/uL (ref 150–400)
RBC: 3.4 MIL/uL — ABNORMAL LOW (ref 3.87–5.11)
RDW: 13.1 % (ref 11.5–15.5)
WBC: 7 10*3/uL (ref 4.0–10.5)
nRBC: 0 % (ref 0.0–0.2)

## 2019-10-22 LAB — URINALYSIS, ROUTINE W REFLEX MICROSCOPIC
Bacteria, UA: NONE SEEN
Bilirubin Urine: NEGATIVE
Glucose, UA: NEGATIVE mg/dL
Hgb urine dipstick: NEGATIVE
Ketones, ur: 20 mg/dL — AB
Nitrite: NEGATIVE
Protein, ur: NEGATIVE mg/dL
Specific Gravity, Urine: 1.018 (ref 1.005–1.030)
pH: 7 (ref 5.0–8.0)

## 2019-10-22 LAB — COMPREHENSIVE METABOLIC PANEL
ALT: 11 U/L (ref 0–44)
AST: 14 U/L — ABNORMAL LOW (ref 15–41)
Albumin: 2.9 g/dL — ABNORMAL LOW (ref 3.5–5.0)
Alkaline Phosphatase: 106 U/L (ref 38–126)
Anion gap: 9 (ref 5–15)
BUN: 5 mg/dL — ABNORMAL LOW (ref 6–20)
CO2: 21 mmol/L — ABNORMAL LOW (ref 22–32)
Calcium: 8.7 mg/dL — ABNORMAL LOW (ref 8.9–10.3)
Chloride: 105 mmol/L (ref 98–111)
Creatinine, Ser: 0.5 mg/dL (ref 0.44–1.00)
GFR calc Af Amer: >60 mL/min (ref >60)
GFR calc non Af Amer: >60 mL/min (ref >60)
Glucose, Bld: 90 mg/dL (ref 70–99)
Potassium: 4 mmol/L (ref 3.5–5.1)
Sodium: 135 mmol/L (ref 135–145)
Total Bilirubin: 0.6 mg/dL (ref 0.3–1.2)
Total Protein: 6.7 g/dL (ref 6.5–8.1)

## 2019-10-22 LAB — SARS CORONAVIRUS 2 BY RT PCR (HOSPITAL ORDER, PERFORMED IN ~~LOC~~ HOSPITAL LAB): SARS Coronavirus 2: NEGATIVE

## 2019-10-22 MED ORDER — ONDANSETRON HCL 4 MG/2ML IJ SOLN
4.0000 mg | Freq: Once | INTRAMUSCULAR | Status: AC
  Administered 2019-10-22: 4 mg via INTRAVENOUS
  Filled 2019-10-22: qty 2

## 2019-10-22 MED ORDER — LACTATED RINGERS IV BOLUS: 1000.0000 mL | Freq: Once | INTRAVENOUS | Status: DC

## 2019-10-22 MED ORDER — SODIUM CHLORIDE 0.9 % IV BOLUS
1000.0000 mL | Freq: Once | INTRAVENOUS | Status: AC
  Administered 2019-10-22: 1000 mL via INTRAVENOUS

## 2019-10-22 NOTE — Discharge Instructions (Addendum)
Try and keep yourself hydrated.  Take your Zofran as needed.  Follow-up with your OB.

## 2019-10-22 NOTE — Progress Notes (Signed)
Pt. Cleared by OB. ER RN notified to d/c EFM. Category I fetal tracing. No ctx. Pt. Negative covid test.

## 2019-10-22 NOTE — Progress Notes (Signed)
Pt. Arrives to [facility] emergency department after covid exposure. Pt. C/o N/VD and SOB. Pt. Oxygen 100% on room air. Pt. Denies LOF/CTX/vag bleeding. Pt. Endorses fetal movement. No OB complaints at this time. Covid test in process.

## 2019-10-22 NOTE — ED Notes (Signed)
Pt placed on fetal monitor- Rapid response nurse called- will place pt in computer and continue to monitor.

## 2019-10-22 NOTE — ED Provider Notes (Signed)
[facility] EMERGENCY DEPARTMENT Provider Note   CSN: [id number] Arrival date & time: 10/22/19  5465     History Chief Complaint  Patient presents with  . Emesis    Kathy Robertson is a 34 y.o. female.  HPI Patient is pregnant.  Around 36 weeks.  Developed nausea vomiting and diarrhea last night.  Mild shortness of breath.  No cough.  Does have some friends family members that were Covid positive but patient was not really all that close to him.  States she was sweaty earlier.  Does still feel baby moving.  No rush of fluid.  States she has been urinating more frequently.  States she was seen in the clinic last week and was dehydrated.    Past Medical History:  Diagnosis Date  . Asthma   . Panic attacks anemia    Patient Active Problem List   Diagnosis Date Noted  . Marijuana use 05/14/2019  . Encounter for supervision of other normal pregnancy, unspecified trimester 05/12/2019  . AMA (advanced maternal age) multigravida 35+ 05/12/2019  . Trichomonal vaginitis in pregnancy 05/12/2019  . Request for sterilization 05/12/2019    Past Surgical History:  Procedure Laterality Date  . foot surgery       OB History    Gravida  6   Para  4   Term  4   Preterm      AB  1   Living  4     SAB  1   TAB      Ectopic      Multiple      Live Births  4           Family History  Problem Relation Age of Onset  . Diabetes Father   . Hypertension Father   . Heart attack Father   . Diabetes Mother   . Hypertension Mother   . Heart failure Mother   . COPD Mother   . Bell's palsy Sister   . Asthma Sister   . Seizures Sister   . Autism Son   . Cancer Maternal Grandmother   . Heart attack Paternal Grandmother     Social History   Tobacco Use  . Smoking status: Former Games developer  . Smokeless tobacco: Never Used  Vaping Use  . Vaping Use: Never used  Substance Use Topics  . Alcohol use: Not Currently    Comment: occasionally  . Drug use: Yes    Types:  Marijuana    Comment: before pregnancy    Home Medications Prior to Admission medications   Medication Sig Start Date End Date Taking? Authorizing Provider  acetaminophen (TYLENOL) 500 MG tablet Take 1,000 mg by mouth every 8 (eight) hours as needed for mild pain or moderate pain.    Yes [provider]  Ferrous Sulfate (IRON PO) Take by mouth. Gummy-takes 2 daily   Yes [provider]  ondansetron (ZOFRAN ODT) 4 MG disintegrating tablet Take 1 tablet (4 mg total) by mouth every 8 (eight) hours as needed for nausea or vomiting. 07/21/19  Yes Arabella Merles, CNM  pantoprazole (PROTONIX) 20 MG tablet Take 1 tablet (20 mg total) by mouth daily. 10/13/19  Yes Arabella Merles, CNM  Prenatal MV-Min-FA-Omega-3 (PRENATAL GUMMIES/DHA & FA PO) Take by mouth.   Yes [provider]  Blood Pressure Monitor MISC For regular home bp monitoring during pregnancy 05/12/19   Arabella Merles, CNM    Allergies    Oseltamivir phosphate  Review  of Systems   Review of Systems  Constitutional: Positive for appetite change and diaphoresis.  HENT: Negative for congestion.   Respiratory: Positive for shortness of breath.   Cardiovascular: Negative for chest pain.  Gastrointestinal: Positive for diarrhea, nausea and vomiting.  Genitourinary: Positive for frequency.  Musculoskeletal: Negative for back pain.  Skin: Negative for rash.  Neurological: Negative for weakness.    Physical Exam Updated Vital Signs BP 122/73   Pulse 74   Temp 97.8 F (36.6 C) (Oral)   Resp 17   Ht 5\' 7"  (1.702 m)   Wt 94.3 kg   LMP 02/12/2019 (Exact Date)   SpO2 99%   BMI 32.58 kg/m   Physical Exam Vitals reviewed.  HENT:     Head: Atraumatic.     Mouth/Throat:     Mouth: Mucous membranes are moist.  Cardiovascular:     Rate and Rhythm: Regular rhythm.  Pulmonary:     Breath sounds: No wheezing, rhonchi or rales.  Abdominal:     Comments: Gravid to well above umbilicus.  Musculoskeletal:         General: No tenderness.  Skin:    General: Skin is warm.     Capillary Refill: Capillary refill takes less than 2 seconds.  Neurological:     Mental Status: She is alert and oriented to person, place, and time.     ED Results / Procedures / Treatments   Labs (all labs ordered are listed, but only abnormal results are displayed) Labs Reviewed  COMPREHENSIVE METABOLIC PANEL - Abnormal; Notable for the following components:      Result Value   CO2 21 (*)    BUN 5 (*)    Calcium 8.7 (*)    Albumin 2.9 (*)    AST 14 (*)    All other components within normal limits  URINALYSIS, ROUTINE W REFLEX MICROSCOPIC - Abnormal; Notable for the following components:   Ketones, ur 20 (*)    Leukocytes,Ua TRACE (*)    All other components within normal limits  CBC - Abnormal; Notable for the following components:   RBC 3.40 (*)    Hemoglobin 10.7 (*)    HCT 32.6 (*)    All other components within normal limits  SARS CORONAVIRUS 2 BY RT PCR (HOSPITAL ORDER, PERFORMED IN [facility])    EKG None  Radiology DG Chest Port 1 View  Result Date: 10/22/2019 CLINICAL DATA:  Shortness of breath.  COVID exposure. EXAM: PORTABLE CHEST 1 VIEW FINDINGS: Mediastinum and hilar structures normal. Heart size stable. Low lung volumes with bibasilar atelectasis. Mild left base infiltrate cannot be excluded. No pleural effusion or pneumothorax. No acute bony abnormality IMPRESSION: Low lung volumes with bibasilar atelectasis. Mild left base infiltrate cannot be excluded. Electronically Signed   By: 10/24/2019  Register   On: 10/22/2019 05:54    Procedures Procedures (including critical care time)  Medications Ordered in ED Medications  lactated ringers bolus 1,000 mL (has no administration in time range)  sodium chloride 0.9 % bolus 1,000 mL (1,000 mLs Intravenous New Bag/Given 10/22/19 0824)  ondansetron (ZOFRAN) injection 4 mg (4 mg Intravenous Given 10/22/19 1042)    ED Course  I have  reviewed the triage vital signs and the nursing notes.  Pertinent labs & imaging results that were available during my care of the patient were reviewed by me and considered in my medical decision making (see chart for details).    MDM Rules/Calculators/A&P  Patient presents with nausea vomiting diarrhea.  Also is pregnant.  Lab work overall reassuring.  Urine showed just some ketones without infection.  Feels better after IV fluids.  Tolerated liquids after Zofran.  Chest x-ray possible pneumonia however no local lung findings and no coughing.  Doubt this is actually pneumonia.  Will discharge home with OB follow-up.  Also negative Covid test. Final Clinical Impression(s) / ED Diagnoses Final diagnoses:  Nausea vomiting and diarrhea  Pregnancy, unspecified gestational age    Rx / DC Orders ED Discharge Orders    None       Benjiman Core, MD 10/22/19 1251

## 2019-10-22 NOTE — ED Notes (Signed)
Per Womens, pt is cleared to be taken off the monitor and to ask pt when next OB appointment is and make sure pt is aware needs to keep and attend that appointment.

## 2019-10-22 NOTE — ED Notes (Signed)
Pt diaphoretic, feels nauseated and lightheaded- cold wash cloth placed on forehead and reassurance given.

## 2019-10-22 NOTE — ED Notes (Signed)
Labs obtained by phelbotomist.

## 2019-10-22 NOTE — ED Triage Notes (Addendum)
Pt c/o N/V/D and SOB that started tonight. States she has been around her friend and her friend's children, and the children tested positive for covid.

## 2019-10-27 ENCOUNTER — Other Ambulatory Visit (HOSPITAL_COMMUNITY)
Admission: RE | Admit: 2019-10-27 | Discharge: 2019-10-27 | Disposition: A | Discharge status: Home / Self Care | Payer: Medicaid Other | Source: Ambulatory Visit | Attending: Advanced Practice Midwife | Admitting: Advanced Practice Midwife

## 2019-10-27 ENCOUNTER — Ambulatory Visit (INDEPENDENT_AMBULATORY_CARE_PROVIDER_SITE_OTHER): Payer: Medicaid Other | Admitting: Advanced Practice Midwife

## 2019-10-27 ENCOUNTER — Encounter: Payer: Self-pay | Admitting: Advanced Practice Midwife

## 2019-10-27 VITALS — BP 116/77 | HR 89 | Wt 215.0 lb

## 2019-10-27 DIAGNOSIS — Z1389 Encounter for screening for other disorder: Secondary | ICD-10-CM

## 2019-10-27 DIAGNOSIS — Z113 Encounter for screening for infections with a predominantly sexual mode of transmission: Secondary | ICD-10-CM | POA: Insufficient documentation

## 2019-10-27 DIAGNOSIS — Z3A36 36 weeks gestation of pregnancy: Secondary | ICD-10-CM | POA: Diagnosis present

## 2019-10-27 DIAGNOSIS — Z3480 Encounter for supervision of other normal pregnancy, unspecified trimester: Secondary | ICD-10-CM | POA: Diagnosis present

## 2019-10-27 DIAGNOSIS — Z331 Pregnant state, incidental: Secondary | ICD-10-CM

## 2019-10-27 LAB — POCT URINALYSIS DIPSTICK OB
Glucose, UA: NEGATIVE
Ketones, UA: NEGATIVE
Leukocytes, UA: NEGATIVE
Nitrite, UA: NEGATIVE

## 2019-10-27 NOTE — Patient Instructions (Signed)
Braxton Hicks Contractions °Contractions of the uterus can occur throughout pregnancy, but they are not always a sign that you are in labor. You may have practice contractions called Braxton Hicks contractions. These false labor contractions are sometimes confused with true labor. °What are Braxton Hicks contractions? °Braxton Hicks contractions are tightening movements that occur in the muscles of the uterus before labor. Unlike true labor contractions, these contractions do not result in opening (dilation) and thinning of the cervix. Toward the end of pregnancy (32-34 weeks), Braxton Hicks contractions can happen more often and may become stronger. These contractions are sometimes difficult to tell apart from true labor because they can be very uncomfortable. You should not feel embarrassed if you go to the hospital with false labor. °Sometimes, the only way to tell if you are in true labor is for your health care provider to look for changes in the cervix. The health care provider will do a physical exam and may monitor your contractions. If you are not in true labor, the exam should show that your cervix is not dilating and your water has not broken. °If there are no other health problems associated with your pregnancy, it is completely safe for you to be sent home with false labor. You may continue to have Braxton Hicks contractions until you go into true labor. °How to tell the difference between true labor and false labor °True labor °· Contractions last 30-70 seconds. °· Contractions become very regular. °· Discomfort is usually felt in the top of the uterus, and it spreads to the lower abdomen and low back. °· Contractions do not go away with walking. °· Contractions usually become more intense and increase in frequency. °· The cervix dilates and gets thinner. °False labor °· Contractions are usually shorter and not as strong as true labor contractions. °· Contractions are usually irregular. °· Contractions  are often felt in the front of the lower abdomen and in the groin. °· Contractions may go away when you walk around or change positions while lying down. °· Contractions get weaker and are shorter-lasting as time goes on. °· The cervix usually does not dilate or become thin. °Follow these instructions at home: ° °· Take over-the-counter and prescription medicines only as told by your health care provider. °· Keep up with your usual exercises and follow other instructions from your health care provider. °· Eat and drink lightly if you think you are going into labor. °· If Braxton Hicks contractions are making you uncomfortable: °? Change your position from lying down or resting to walking, or change from walking to resting. °? Sit and rest in a tub of warm water. °? Drink enough fluid to keep your urine pale yellow. Dehydration may cause these contractions. °? Do slow and deep breathing several times an hour. °· Keep all follow-up prenatal visits as told by your health care provider. This is important. °Contact a health care provider if: °· You have a fever. °· You have continuous pain in your abdomen. °Get help right away if: °· Your contractions become stronger, more regular, and closer together. °· You have fluid leaking or gushing from your vagina. °· You pass blood-tinged mucus (bloody show). °· You have bleeding from your vagina. °· You have low back pain that you never had before. °· You feel your baby’s head pushing down and causing pelvic pressure. °· Your baby is not moving inside you as much as it used to. °Summary °· Contractions that occur before labor are   called Braxton Hicks contractions, false labor, or practice contractions. °· Braxton Hicks contractions are usually shorter, weaker, farther apart, and less regular than true labor contractions. True labor contractions usually become progressively stronger and regular, and they become more frequent. °· Manage discomfort from Braxton Hicks contractions  by changing position, resting in a warm bath, drinking plenty of water, or practicing deep breathing. °This information is not intended to replace advice given to you by your health care provider. Make sure you discuss any questions you have with your health care provider. °Document Revised: 01/31/2017 Document Reviewed: 07/04/2016 °Elsevier Patient Education © 2020 [employer] ° °

## 2019-10-27 NOTE — Progress Notes (Signed)
   LOW-RISK PREGNANCY VISIT Patient name: Kathy Robertson MRN [id number]  Date of birth: Apr 13, 1985 Chief Complaint:   Routine Prenatal Visit (GBS, GC/CHL; STD screening)  History of Present Illness:   Kathy Robertson is a 34 y.o. [id number] female at [redacted]w[redacted]d with an Estimated Date of Delivery: 11/19/19 being seen today for ongoing management of a low-risk pregnancy.  Today she reports seen at [facility] on 8/20 for N/V- feels better after hydration with IVF; still with intermittent vomiting; separated from SO and requesting STI testing. Contractions: Not present. Vag. Bleeding: None.  Movement: Present. denies leaking of fluid. Review of Systems:   Pertinent items are noted in HPI Denies abnormal vaginal discharge w/ itching/odor/irritation, headaches, visual changes, shortness of breath, chest pain, abdominal pain, severe nausea/vomiting, or problems with urination or bowel movements unless otherwise stated above. Pertinent History Reviewed:  Reviewed past medical,surgical, social, obstetrical and family history.  Reviewed problem list, medications and allergies. Physical Assessment:   Vitals:   10/27/19 0921  BP: 116/77  Pulse: 89  Weight: 215 lb (97.5 kg)  Body mass index is 33.67 kg/m.        Physical Examination:   General appearance: Well appearing, and in no distress  Mental status: Alert, oriented to person, place, and time  Skin: Warm & dry  Cardiovascular: Normal heart rate noted  Respiratory: Normal respiratory effort, no distress  Abdomen: Soft, gravid, nontender  Pelvic: Cervical exam performed  Dilation: Closed Effacement (%): 50 Station: -3  Extremities: Edema: Trace  Fetal Status: Fetal Heart Rate (bpm): 145 Fundal Height: 37 cm Movement: Present Presentation: Vertex  Results for orders placed or performed in visit on 10/27/19 (from the past 24 hour(s))  POC Urinalysis Dipstick OB   Collection Time: 10/27/19  9:27 AM  Result Value Ref Range   Color, UA     Clarity, UA      Glucose, UA Negative Negative   Bilirubin, UA     Ketones, UA neg    Spec Grav, UA     Blood, UA trace    pH, UA     POC,PROTEIN,UA Small (1+) Negative, Trace, Small (1+), Moderate (2+), Large (3+), 4+   Urobilinogen, UA     Nitrite, UA neg    Leukocytes, UA Negative Negative   Appearance     Odor      Assessment & Plan:  1) Low-risk pregnancy [id number] at [redacted]w[redacted]d with an Estimated Date of Delivery: 11/19/19   2) Vomiting/hearting, will increase Protonix to bid  3) Wants BTL, papers signed 6/16  4) Concern for partner infidelity, STI check w/ CV swab   Meds: No orders of the defined types were placed in this encounter.  Labs/procedures today: SVE, GBS, GC/chlam and CV swab  Plan:  Continue routine obstetrical care   Reviewed: Term labor symptoms and general obstetric precautions including but not limited to vaginal bleeding, contractions, leaking of fluid and fetal movement were reviewed in detail with the patient.  All questions were answered. Has home bp cuff. Check bp weekly, let us know if >140/90.   Follow-up: Return in about 1 week (around 11/03/2019) for LROB, in person.  Orders Placed This Encounter  Procedures  . Strep Gp B NAA  . POC Urinalysis Dipstick OB   Arabella Merles [facility] 10/27/2019 9:50 AM

## 2019-10-28 ENCOUNTER — Other Ambulatory Visit: Payer: Self-pay | Admitting: Advanced Practice Midwife

## 2019-10-28 LAB — CERVICOVAGINAL ANCILLARY ONLY
Chlamydia: NEGATIVE
Comment: NEGATIVE
Comment: NEGATIVE
Comment: NORMAL
Neisseria Gonorrhea: NEGATIVE
Trichomonas: POSITIVE — AB

## 2019-10-28 MED ORDER — METRONIDAZOLE 500 MG PO TABS: 500.0000 mg | ORAL_TABLET | Freq: Two times a day (BID) | ORAL | 0 refills | Status: DC

## 2019-10-29 ENCOUNTER — Telehealth: Payer: Self-pay | Admitting: *Deleted

## 2019-10-29 LAB — STREP GP B NAA: Strep Gp B NAA: NEGATIVE

## 2019-10-29 NOTE — Telephone Encounter (Signed)
Spoke with patient. Informed she has tested positive for trich. Medication has been sent to pharmacy. Will need to take all of the medication.  Partner needs treatment as well.  States they have not talked in several weeks.  Advised she should let him know and we can treat him.  Verbalized understanding.

## 2019-11-02 ENCOUNTER — Encounter: Payer: Self-pay | Admitting: Obstetrics & Gynecology

## 2019-11-02 ENCOUNTER — Ambulatory Visit (INDEPENDENT_AMBULATORY_CARE_PROVIDER_SITE_OTHER): Payer: Medicaid Other | Admitting: Obstetrics & Gynecology

## 2019-11-02 VITALS — BP 112/67 | HR 83 | Wt 216.0 lb

## 2019-11-02 DIAGNOSIS — Z3480 Encounter for supervision of other normal pregnancy, unspecified trimester: Secondary | ICD-10-CM

## 2019-11-02 DIAGNOSIS — Z3A37 37 weeks gestation of pregnancy: Secondary | ICD-10-CM

## 2019-11-02 DIAGNOSIS — Z1389 Encounter for screening for other disorder: Secondary | ICD-10-CM | POA: Diagnosis not present

## 2019-11-02 DIAGNOSIS — Z331 Pregnant state, incidental: Secondary | ICD-10-CM

## 2019-11-02 LAB — POCT URINALYSIS DIPSTICK OB
Blood, UA: NEGATIVE
Glucose, UA: NEGATIVE
Ketones, UA: NEGATIVE
Leukocytes, UA: NEGATIVE
Nitrite, UA: NEGATIVE
POC,PROTEIN,UA: NEGATIVE

## 2019-11-02 NOTE — Progress Notes (Signed)
LOW-RISK PREGNANCY VISIT Patient name: Kathy Robertson MRN [id number]  Date of birth: 1985/05/31 Chief Complaint:   Leaking Fluid  History of Present Illness:   Kathy Robertson is a 34 y.o. [id number] female at [redacted]w[redacted]d with an Estimated Date of Delivery: 11/19/19 being seen today for ongoing management of a low-risk pregnancy.  Depression screen [facility] 2/9 08/18/2019 05/12/2019 05/08/2017  Decreased Interest 1 0 1  Down, Depressed, Hopeless 1 1 2   PHQ - 2 Score 2 1 3   Altered sleeping 2 1 -  Tired, decreased energy 2 1 1   Change in appetite 2 1 -  Feeling bad or failure about yourself  1 0 2  Trouble concentrating 1 1 1   Moving slowly or fidgety/restless 0 0 1  Suicidal thoughts 0 0 1  PHQ-9 Score 10 5 -  Difficult doing work/chores - - Somewhat difficult    Today she reports no complaints. Contractions: Not present. Vag. Bleeding: None.  Movement: Present. denies leaking of fluid. Review of Systems:   Pertinent items are noted in HPI Denies abnormal vaginal discharge w/ itching/odor/irritation, headaches, visual changes, shortness of breath, chest pain, abdominal pain, severe nausea/vomiting, or problems with urination or bowel movements unless otherwise stated above. Pertinent History Reviewed:  Reviewed past medical,surgical, social, obstetrical and family history.  Reviewed problem list, medications and allergies. Physical Assessment:   Vitals:   11/02/19 1338  BP: 112/67  Pulse: 83  Weight: 216 lb (98 kg)  Body mass index is 33.83 kg/m.        Physical Examination:   General appearance: Well appearing, and in no distress  Mental status: Alert, oriented to person, place, and time  Skin: Warm & dry  Cardiovascular: Normal heart rate noted  Respiratory: Normal respiratory effort, no distress  Abdomen: Soft, gravid, nontender  Pelvic: Cervical exam performed         Extremities: Edema: Trace  Fetal Status:     Movement: Present    Chaperone: n/a    Results for orders  placed or performed in visit on 11/02/19 (from the past 24 hour(s))  POC Urinalysis Dipstick OB   Collection Time: 11/02/19  1:35 PM  Result Value Ref Range   Color, UA     Clarity, UA     Glucose, UA Negative Negative   Bilirubin, UA     Ketones, UA n    Spec Grav, UA     Blood, UA n    pH, UA     POC,PROTEIN,UA Negative Negative, Trace, Small (1+), Moderate (2+), Large (3+), 4+   Urobilinogen, UA     Nitrite, UA n    Leukocytes, UA Negative Negative   Appearance     Odor      Assessment & Plan:  1) Low-risk pregnancy 11/04/19 at [redacted]w[redacted]d with an Estimated Date of Delivery: 11/19/19   2) IBOW,    Meds: No orders of the defined types were placed in this encounter.  Labs/procedures today:   Plan:  Continue routine obstetrical care  Next visit: prefers in person    Reviewed: Term labor symptoms and general obstetric precautions including but not limited to vaginal bleeding, contractions, leaking of fluid and fetal movement were reviewed in detail with the patient.  All questions were answered. Has home bp cuff. Rx faxed to . Check bp weekly, let 11/04/19 know if >140/90.   Follow-up: No follow-ups on file.  Orders Placed This Encounter  Procedures  . POC Urinalysis Dipstick OB   [id number]  H Dorathea Faerber  11/02/2019 2:22 PM

## 2019-11-04 ENCOUNTER — Encounter: Payer: Medicaid Other | Admitting: Women's Health

## 2019-11-11 ENCOUNTER — Ambulatory Visit (INDEPENDENT_AMBULATORY_CARE_PROVIDER_SITE_OTHER): Payer: Medicaid Other | Admitting: Advanced Practice Midwife

## 2019-11-11 ENCOUNTER — Other Ambulatory Visit: Payer: Self-pay

## 2019-11-11 ENCOUNTER — Encounter: Payer: Self-pay | Admitting: Advanced Practice Midwife

## 2019-11-11 VITALS — BP 105/65 | HR 79 | Wt 215.0 lb

## 2019-11-11 DIAGNOSIS — Z1389 Encounter for screening for other disorder: Secondary | ICD-10-CM

## 2019-11-11 DIAGNOSIS — Z113 Encounter for screening for infections with a predominantly sexual mode of transmission: Secondary | ICD-10-CM

## 2019-11-11 DIAGNOSIS — Z3A38 38 weeks gestation of pregnancy: Secondary | ICD-10-CM | POA: Diagnosis not present

## 2019-11-11 DIAGNOSIS — Z331 Pregnant state, incidental: Secondary | ICD-10-CM | POA: Diagnosis not present

## 2019-11-11 DIAGNOSIS — Z3480 Encounter for supervision of other normal pregnancy, unspecified trimester: Secondary | ICD-10-CM

## 2019-11-11 LAB — POCT URINALYSIS DIPSTICK OB
Blood, UA: NEGATIVE
Glucose, UA: NEGATIVE
Ketones, UA: NEGATIVE
Leukocytes, UA: NEGATIVE
Nitrite, UA: NEGATIVE
POC,PROTEIN,UA: NEGATIVE

## 2019-11-11 NOTE — Patient Instructions (Addendum)
AM I IN LABOR? What is labor? Labor is the work that your body does to birth your baby. Your uterus (the womb) contracts. Your cervix (the mouth of the uterus) opens. You will push your baby out into the world.  What do contractions (labor pains) feel like? When they first start, contractions usually feel like cramps during your period. Sometimes you feel pain in your back. Most often, contractions feel like muscles pulling painfully in your lower belly. At first, the contractions will probably be 15 to 20 minutes apart. They will not feel too painful. As labor goes on, the contractions get stronger, closer together, and more painful.  How do I time the contractions? Time your contractions by counting the number of minutes from the start of one contraction to the start of the next contraction.  What should I do when the contractions start? If it is night and you can sleep, sleep. If it happens during the day, here are some things you can do to take care of yourself at home: ? Walk. If the pains you are having are real labor, walking will make the contractions come faster and harder. If the contractions are not going to continue and be real labor, walking will make the contractions slow down. ? Take a shower or bath. This will help you relax. ? Eat. Labor is a big event. It takes a lot of energy. ? Drink water. Not drinking enough water can cause false labor (contractions that hurt but do not open your cervix). If this is true labor, drinking water will help you have strength to get through your labor. ? Take a nap. Get all the rest you can. ? Get a massage. If your labor is in your back, a strong massage on your lower back may feel very good. Getting a foot massage is always good. ? Don't panic. You can do this. Your body was made for this. You are strong!  When should I go to the hospital or call my health care provider? ? Your contractions have been 5 minutes apart or less for at least 1  hour. ? If several contractions are so painful you cannot walk or talk during one. ? Your bag of waters breaks. (You may have a big gush of water or just water that runs down your legs when you walk.)  Are there other reasons to call my health care provider? Yes, you should call your health care provider or go to the hospital if you start to bleed like you are having a period-- blood that soaks your underwear or runs down your legs, if you have sudden severe pain, if your baby has not moved for several hours, or if you are leaking green fluid. The rule is as follows: If you are very concerned about something, call.    Cervical Ripening (to get your cervix ready for labor) : May try one or all:  Red Raspberry Leaf capsules:  two 300mg or 400mg tablets with each meal, 2-3 times a day  Potential Side Effects Of Raspberry Leaf:  Most women do not experience any side effects from drinking raspberry leaf tea. However, nausea and loose stools are possible     Evening Primrose Oil capsules: may take 1 to 3 capsules daily. May also prick one to release the oil and insert it into your vagina at night.  Some of the potential side effects:  Upset stomach  Loose stools or diarrhea  Headaches  Nausea   4 Dates   a day (may taste better if warmed in microwave until soft). Found where raisins are in the grocery store  

## 2019-11-11 NOTE — Progress Notes (Signed)
   LOW-RISK PREGNANCY VISIT Patient name: Kathy Robertson MRN [id number]  Date of birth: 02-15-1986 Chief Complaint:   Routine Prenatal Visit  History of Present Illness:   Kathy Robertson is a 34 y.o. [id number] female at [redacted]w[redacted]d with an Estimated Date of Delivery: 11/19/19 being seen today for ongoing management of a low-risk pregnancy.  Today she reports no complaints. Contractions: Irregular. Vag. Bleeding: None.  Movement: Present. denies leaking of fluid. Review of Systems:   Pertinent items are noted in HPI Denies abnormal vaginal discharge w/ itching/odor/irritation, headaches, visual changes, shortness of breath, chest pain, abdominal pain, severe nausea/vomiting, or problems with urination or bowel movements unless otherwise stated above. Pertinent History Reviewed:  Reviewed past medical,surgical, social, obstetrical and family history.  Reviewed problem list, medications and allergies. Physical Assessment:   Vitals:   11/11/19 0935  BP: 105/65  Pulse: 79  Weight: 215 lb (97.5 kg)  Body mass index is 33.67 kg/m.        Physical Examination:   General appearance: Well appearing, and in no distress  Mental status: Alert, oriented to person, place, and time  Skin: Warm & dry  Cardiovascular: Normal heart rate noted  Respiratory: Normal respiratory effort, no distress  Abdomen: Soft, gravid, nontender  Pelvic: Cervical exam performed  Dilation: 1.5 Effacement (%): Thick Station: -3  Extremities: Edema: Trace  Fetal Status: Fetal Heart Rate (bpm): 142 Fundal Height: 38 cm Movement: Present Presentation: Vertex  Chaperone: Malachy Mood    Results for orders placed or performed in visit on 11/11/19 (from the past 24 hour(s))  POC Urinalysis Dipstick OB   Collection Time: 11/11/19  9:43 AM  Result Value Ref Range   Color, UA     Clarity, UA     Glucose, UA Negative Negative   Bilirubin, UA     Ketones, UA neg    Spec Grav, UA     Blood, UA neg    pH, UA      POC,PROTEIN,UA Negative Negative, Trace, Small (1+), Moderate (2+), Large (3+), 4+   Urobilinogen, UA     Nitrite, UA neg    Leukocytes, UA Negative Negative   Appearance     Odor      Assessment & Plan:  1) Low-risk pregnancy [id number] at [redacted]w[redacted]d with an Estimated Date of Delivery: 11/19/19     Meds: No orders of the defined types were placed in this encounter.  Labs/procedures today: none  Plan:  Continue routine obstetrical care  Next visit: prefers in person    Reviewed: Term labor symptoms and general obstetric precautions including but not limited to vaginal bleeding, contractions, leaking of fluid and fetal movement were reviewed in detail with the patient.  All questions were answered. Has home bp cuff. Check bp weekly, let us know if >140/90.   Follow-up: Return in about 1 week (around 11/18/2019) for LROB (doesn't want unvaccinated providers/nurses).  Orders Placed This Encounter  Procedures  . Trichomonas vaginalis, RNA  . POC Urinalysis Dipstick OB   Jacklyn Shell DNP, CNM 11/11/2019 10:15 AM

## 2019-11-13 ENCOUNTER — Inpatient Hospital Stay (HOSPITAL_COMMUNITY): Payer: Medicaid Other | Admitting: Anesthesiology

## 2019-11-13 ENCOUNTER — Inpatient Hospital Stay (HOSPITAL_COMMUNITY)
Admission: AD | Admit: 2019-11-13 | Discharge: 2019-11-15 | DRG: 797 | Disposition: A | Discharge status: Home / Self Care | Payer: Medicaid Other | Attending: Obstetrics & Gynecology | Admitting: Obstetrics & Gynecology

## 2019-11-13 ENCOUNTER — Encounter (HOSPITAL_COMMUNITY): Payer: Self-pay | Admitting: Obstetrics & Gynecology

## 2019-11-13 ENCOUNTER — Emergency Department (HOSPITAL_COMMUNITY)
Admission: EM | Admit: 2019-11-13 | Discharge: 2019-11-13 | Disposition: A | Discharge status: Home / Self Care | Payer: Medicaid Other | Source: Home / Self Care | Attending: Emergency Medicine | Admitting: Emergency Medicine

## 2019-11-13 ENCOUNTER — Encounter (HOSPITAL_COMMUNITY): Payer: Self-pay

## 2019-11-13 ENCOUNTER — Other Ambulatory Visit: Payer: Self-pay

## 2019-11-13 DIAGNOSIS — Z3A39 39 weeks gestation of pregnancy: Secondary | ICD-10-CM

## 2019-11-13 DIAGNOSIS — O368130 Decreased fetal movements, third trimester, not applicable or unspecified: Principal | ICD-10-CM | POA: Diagnosis present

## 2019-11-13 DIAGNOSIS — Z3490 Encounter for supervision of normal pregnancy, unspecified, unspecified trimester: Secondary | ICD-10-CM

## 2019-11-13 DIAGNOSIS — J45909 Unspecified asthma, uncomplicated: Secondary | ICD-10-CM | POA: Diagnosis present

## 2019-11-13 DIAGNOSIS — Z20822 Contact with and (suspected) exposure to covid-19: Secondary | ICD-10-CM | POA: Diagnosis present

## 2019-11-13 DIAGNOSIS — Z302 Encounter for sterilization: Secondary | ICD-10-CM | POA: Diagnosis not present

## 2019-11-13 DIAGNOSIS — E669 Obesity, unspecified: Secondary | ICD-10-CM | POA: Diagnosis present

## 2019-11-13 DIAGNOSIS — F1290 Cannabis use, unspecified, uncomplicated: Secondary | ICD-10-CM | POA: Diagnosis present

## 2019-11-13 DIAGNOSIS — O99214 Obesity complicating childbirth: Secondary | ICD-10-CM | POA: Diagnosis present

## 2019-11-13 DIAGNOSIS — O368131 Decreased fetal movements, third trimester, fetus 1: Secondary | ICD-10-CM | POA: Diagnosis not present

## 2019-11-13 DIAGNOSIS — Z87891 Personal history of nicotine dependence: Secondary | ICD-10-CM

## 2019-11-13 DIAGNOSIS — O99324 Drug use complicating childbirth: Secondary | ICD-10-CM | POA: Diagnosis present

## 2019-11-13 DIAGNOSIS — A5901 Trichomonal vulvovaginitis: Secondary | ICD-10-CM | POA: Diagnosis present

## 2019-11-13 DIAGNOSIS — O09529 Supervision of elderly multigravida, unspecified trimester: Secondary | ICD-10-CM

## 2019-11-13 DIAGNOSIS — O09523 Supervision of elderly multigravida, third trimester: Secondary | ICD-10-CM

## 2019-11-13 DIAGNOSIS — Z3480 Encounter for supervision of other normal pregnancy, unspecified trimester: Secondary | ICD-10-CM

## 2019-11-13 HISTORY — DX: Anemia, unspecified: D64.9

## 2019-11-13 LAB — URINALYSIS, ROUTINE W REFLEX MICROSCOPIC
Bilirubin Urine: NEGATIVE
Glucose, UA: NEGATIVE mg/dL
Ketones, ur: 15 mg/dL — AB
Nitrite: NEGATIVE
Protein, ur: NEGATIVE mg/dL
Specific Gravity, Urine: 1.02 (ref 1.005–1.030)
pH: 8 (ref 5.0–8.0)

## 2019-11-13 LAB — URINALYSIS, MICROSCOPIC (REFLEX): RBC / HPF: NONE SEEN RBC/hpf (ref 0–5)

## 2019-11-13 LAB — CBC
HCT: 38.2 % (ref 36.0–46.0)
Hemoglobin: 12.3 g/dL (ref 12.0–15.0)
MCH: 30.6 pg (ref 26.0–34.0)
MCHC: 32.2 g/dL (ref 30.0–36.0)
MCV: 95 fL (ref 80.0–100.0)
Platelets: 232 10*3/uL (ref 150–400)
RBC: 4.02 MIL/uL (ref 3.87–5.11)
RDW: 14.1 % (ref 11.5–15.5)
WBC: 7 10*3/uL (ref 4.0–10.5)
nRBC: 0 % (ref 0.0–0.2)

## 2019-11-13 LAB — TRICHOMONAS VAGINALIS, PROBE AMP: Trich vag by NAA: NEGATIVE

## 2019-11-13 LAB — TYPE AND SCREEN
ABO/RH(D): B POS
Antibody Screen: NEGATIVE

## 2019-11-13 LAB — SARS CORONAVIRUS 2 BY RT PCR (HOSPITAL ORDER, PERFORMED IN ~~LOC~~ HOSPITAL LAB): SARS Coronavirus 2: NEGATIVE

## 2019-11-13 MED ORDER — OXYCODONE-ACETAMINOPHEN 5-325 MG PO TABS: 1.0000 | ORAL_TABLET | ORAL | Status: DC | PRN

## 2019-11-13 MED ORDER — OXYCODONE-ACETAMINOPHEN 5-325 MG PO TABS: 2.0000 | ORAL_TABLET | ORAL | Status: DC | PRN

## 2019-11-13 MED ORDER — OXYTOCIN-SODIUM CHLORIDE 30-0.9 UT/500ML-% IV SOLN: 2.5000 [IU]/h | INTRAVENOUS | Status: DC

## 2019-11-13 MED ORDER — EPHEDRINE 5 MG/ML INJ: 10.0000 mg | INTRAVENOUS | Status: DC | PRN

## 2019-11-13 MED ORDER — ACETAMINOPHEN 325 MG PO TABS: 650.0000 mg | ORAL_TABLET | ORAL | Status: DC | PRN

## 2019-11-13 MED ORDER — PHENYLEPHRINE 40 MCG/ML (10ML) SYRINGE FOR IV PUSH (FOR BLOOD PRESSURE SUPPORT): 80.0000 ug | PREFILLED_SYRINGE | INTRAVENOUS | Status: DC | PRN

## 2019-11-13 MED ORDER — MISOPROSTOL 25 MCG QUARTER TABLET: 25.0000 ug | ORAL_TABLET | ORAL | Status: DC | PRN

## 2019-11-13 MED ORDER — TERBUTALINE SULFATE 1 MG/ML IJ SOLN: 0.2500 mg | Freq: Once | INTRAMUSCULAR | Status: DC | PRN

## 2019-11-13 MED ORDER — FLEET ENEMA 7-19 GM/118ML RE ENEM: 1.0000 | ENEMA | RECTAL | Status: DC | PRN

## 2019-11-13 MED ORDER — OXYTOCIN BOLUS FROM INFUSION
333.0000 mL | Freq: Once | INTRAVENOUS | Status: AC
  Administered 2019-11-14: 333 mL via INTRAVENOUS

## 2019-11-13 MED ORDER — SOD CITRATE-CITRIC ACID 500-334 MG/5ML PO SOLN: 30.0000 mL | ORAL | Status: DC | PRN

## 2019-11-13 MED ORDER — ONDANSETRON HCL 4 MG/2ML IJ SOLN
4.0000 mg | Freq: Four times a day (QID) | INTRAMUSCULAR | Status: DC | PRN
  Administered 2019-11-13: 4 mg via INTRAVENOUS
  Filled 2019-11-13: qty 2

## 2019-11-13 MED ORDER — LIDOCAINE HCL (PF) 1 % IJ SOLN
INTRAMUSCULAR | Status: DC | PRN
  Administered 2019-11-13: 4 mL via EPIDURAL
  Administered 2019-11-13: 5 mL via EPIDURAL

## 2019-11-13 MED ORDER — DIPHENHYDRAMINE HCL 50 MG/ML IJ SOLN: 12.5000 mg | INTRAMUSCULAR | Status: DC | PRN

## 2019-11-13 MED ORDER — LACTATED RINGERS IV SOLN
INTRAVENOUS | Status: DC
  Administered 2019-11-13: 125 mL via INTRAVENOUS

## 2019-11-13 MED ORDER — LACTATED RINGERS IV SOLN: 500.0000 mL | Freq: Once | INTRAVENOUS | Status: DC

## 2019-11-13 MED ORDER — LIDOCAINE HCL (PF) 1 % IJ SOLN: 30.0000 mL | INTRAMUSCULAR | Status: DC | PRN

## 2019-11-13 MED ORDER — LACTATED RINGERS IV SOLN: 500.0000 mL | INTRAVENOUS | Status: DC | PRN

## 2019-11-13 MED ORDER — FENTANYL CITRATE (PF) 100 MCG/2ML IJ SOLN
100.0000 ug | INTRAMUSCULAR | Status: DC | PRN
  Administered 2019-11-13: 100 ug via INTRAVENOUS

## 2019-11-13 MED ORDER — OXYTOCIN-SODIUM CHLORIDE 30-0.9 UT/500ML-% IV SOLN
1.0000 m[IU]/min | INTRAVENOUS | Status: DC
  Administered 2019-11-14: 2 m[IU]/min via INTRAVENOUS
  Filled 2019-11-13: qty 500

## 2019-11-13 MED ORDER — FENTANYL CITRATE (PF) 100 MCG/2ML IJ SOLN
INTRAMUSCULAR | Status: AC
Start: 2019-11-13 — End: 2019-11-14
  Filled 2019-11-13: qty 2

## 2019-11-13 MED ORDER — SODIUM CHLORIDE (PF) 0.9 % IJ SOLN
INTRAMUSCULAR | Status: DC | PRN
  Administered 2019-11-13: 12 mL/h via EPIDURAL

## 2019-11-13 MED ORDER — FENTANYL-BUPIVACAINE-NACL 0.5-0.125-0.9 MG/250ML-% EP SOLN
12.0000 mL/h | EPIDURAL | Status: DC | PRN
  Filled 2019-11-13: qty 250

## 2019-11-13 NOTE — Progress Notes (Signed)
Received call from [facility] RN. Pt is a G6P4, all vaginal deliveries, presenting with c/o uc's that started last night. RN says pt does not have vaginal bleeding or leaking of fluid. She says that the NP has checked the pt's cervix and she is 1cm, thk, presenting part is high. The pt gets her care at [facility].

## 2019-11-13 NOTE — Progress Notes (Signed)
Labor Progress Note Kathy Robertson is a 34 y.o. [id number] at [redacted]w[redacted]d presented for IOL secondary to decreased fetal movement.  S: Doing well without complaints.  O:  BP 113/66    Pulse 71    Temp 97.8 F (36.6 C) (Oral)    Resp 16    LMP 02/12/2019 (Exact Date)    SpO2 100% Comment: room air EFM: baseline 140/moderate variability/+accels/intermittent early decels with contractions, quick recovery to baseline Toco: q1-6 minutes  CVE: Dilation: 7 Effacement (%): 50 Station: -2 Presentation: Vertex Exam by:: Dr. Germaine Pomfret    A&P: 34 y.o. [id number] [redacted]w[redacted]d presenting for IOL secondary to decreased fetal movement.  #Labor: Progressing well. S/p AROM for meconium stained fluid @1610 . Continue expectant management, as patient making good cervical change. #Pain: epidural in place #FWB: Category 2 #GBS negative  #Plan for PP BTL: Papers signed 08/18/19. Pt aware of plan for NPO @[place]  s/p delivery. #Marijuana Use: plan for SW prior to discharge  08/20/19, MD 9:39 PM

## 2019-11-13 NOTE — Discharge Instructions (Addendum)
To [facility] now.

## 2019-11-13 NOTE — ED Triage Notes (Signed)
Pt reports is 39 weeks and 1 day pregant.  Reports contractions since last night coming approx every 7 min.  Reports this is her 5th preganancy, has 4 living children.  Reports has had some spotting and discharge.

## 2019-11-13 NOTE — Progress Notes (Signed)
Spoke with Dr. Macon Large. Pt is a G6P4 at 39 1/[redacted] weeks gestation presenting to [facility] with c/o uc's that started last night. The PA has checked the pt's cervix and says that she 1cm, thk, presenting part is high. No vaginal bleeding or leaking of fluid. The pt gets her care at [facility].There are no uc's tracing at this time and the FHR tracing is a category 1.There are no orders in the pt's chart. We will continue to observe the tracing.

## 2019-11-13 NOTE — Progress Notes (Signed)
LABOR PROGRESS NOTE  Kathy Robertson is a 34 y.o. [id number] at [redacted]w[redacted]d  admitted for IOL due to decreased fetal movement.   Subjective: Went to evaluate due to increased early decels, starting to feel some more pressure.   Objective: BP 114/69   Pulse (!) 133   Temp 98.4 F (36.9 C) (Oral)   Resp 16   LMP 02/12/2019 (Exact Date)   SpO2 100% Comment: room air or  Vitals:   11/13/19 2130 11/13/19 2148 11/13/19 2200 11/13/19 2253  BP: 113/66  114/69   Pulse: 71  (!) 133   Resp:  18  16  Temp:    98.4 F (36.9 C)  TempSrc:    Oral  SpO2:        Dilation: 7 Effacement (%): 50 Station: 0 Presentation: Vertex Exam by:: Dr. Annia Friendly  FHT: baseline rate 135, moderate varibility, +acel, early decel Toco: every 2-4 minutes   Labs: Lab Results  Component Value Date   WBC 7.0 11/13/2019   HGB 12.3 11/13/2019   HCT 38.2 11/13/2019   MCV 95.0 11/13/2019   PLT 232 11/13/2019    Patient Active Problem List   Diagnosis Date Noted  . Term pregnancy 11/13/2019  . Marijuana use 05/14/2019  . Encounter for supervision of other normal pregnancy, unspecified trimester 05/12/2019  . AMA (advanced maternal age) multigravida 35+ 05/12/2019  . Trichomonal vaginitis in pregnancy 05/12/2019  . Request for sterilization 05/12/2019    Assessment / Plan: Kathy y.o. [id number] at [redacted]w[redacted]d here for IOL due to decreased fetal movements.   Labor: No change since last check, AROM of forebag remaining. Will consider pit on next check if no change at that time.  Fetal Wellbeing:  Cat 1  Pain Control:  Epidural in place  Anticipated MOD:  SVD  Kathy Penna, DO Family Medicine PGY-3  11/13/2019, 11:11 PM

## 2019-11-13 NOTE — ED Provider Notes (Signed)
[facility] EMERGENCY DEPARTMENT Provider Note   CSN: [id number] Arrival date & time: 11/13/19  6160     History Chief Complaint  Patient presents with  . Contractions    Kathy Robertson is a 34 y.o. female.  Pt reports she is [redacted] weeks pregnant.  Pt began contracting yesterday.  Pt is followed by Dr. Emelda Fear.    The history is provided by the patient. No language interpreter was used.  Possible Pregnancy This is a new problem. The current episode started yesterday. The problem occurs constantly. The problem has been gradually worsening. Nothing aggravates the symptoms. Nothing relieves the symptoms. She has tried nothing for the symptoms. The treatment provided no relief.   Pt reports her due date is 9/17. Pt is a G6p4014  Pt is b positive    Past Medical History:  Diagnosis Date  . Asthma   . Panic attacks anemia    Patient Active Problem List   Diagnosis Date Noted  . Marijuana use 05/14/2019  . Encounter for supervision of other normal pregnancy, unspecified trimester 05/12/2019  . AMA (advanced maternal age) multigravida 35+ 05/12/2019  . Trichomonal vaginitis in pregnancy 05/12/2019  . Request for sterilization 05/12/2019    Past Surgical History:  Procedure Laterality Date  . foot surgery       OB History    Gravida  6   Para  4   Term  4   Preterm      AB  1   Living  4     SAB  1   TAB      Ectopic      Multiple      Live Births  4           Family History  Problem Relation Age of Onset  . Diabetes Father   . Hypertension Father   . Heart attack Father   . Diabetes Mother   . Hypertension Mother   . Heart failure Mother   . COPD Mother   . Bell's palsy Sister   . Asthma Sister   . Seizures Sister   . Autism Son   . Cancer Maternal Grandmother   . Heart attack Paternal Grandmother     Social History   Tobacco Use  . Smoking status: Former Games developer  . Smokeless tobacco: Never Used  Vaping Use  . Vaping Use: Never  used  Substance Use Topics  . Alcohol use: Not Currently    Comment: occasionally  . Drug use: Yes    Types: Marijuana    Comment: before pregnancy    Home Medications Prior to Admission medications   Medication Sig Start Date End Date Taking? Authorizing Provider  acetaminophen (TYLENOL) 500 MG tablet Take 1,000 mg by mouth every 8 (eight) hours as needed for mild pain or moderate pain.  Patient not taking: Reported on 11/11/2019    [provider]  Blood Pressure Monitor MISC For regular home bp monitoring during pregnancy 05/12/19   Arabella Merles, CNM  Ferrous Sulfate (IRON PO) Take by mouth. Gummy-takes 2 daily    [provider]  ondansetron (ZOFRAN ODT) 4 MG disintegrating tablet Take 1 tablet (4 mg total) by mouth every 8 (eight) hours as needed for nausea or vomiting. Patient not taking: Reported on 11/11/2019 07/21/19   Arabella Merles, CNM  pantoprazole (PROTONIX) 20 MG tablet Take 1 tablet (20 mg total) by mouth daily. 10/13/19   Arabella Merles, CNM  Prenatal MV-Min-FA-Omega-3 (PRENATAL  GUMMIES/DHA & FA PO) Take by mouth.    [provider]    Allergies    Oseltamivir phosphate  Review of Systems   Review of Systems  All other systems reviewed and are negative.   Physical Exam Updated Vital Signs BP 126/81   Pulse (!) 56   Temp 97.9 F (36.6 C) (Oral)   Resp 18   Ht 5\' 7"  (1.702 m)   Wt 98 kg   LMP 02/12/2019 (Exact Date)   SpO2 99%   BMI 33.83 kg/m   Physical Exam Vitals and nursing note reviewed.  Constitutional:      Appearance: She is well-developed.  HENT:     Head: Normocephalic.     Mouth/Throat:     Mouth: Mucous membranes are moist.  Cardiovascular:     Rate and Rhythm: Normal rate.  Pulmonary:     Effort: Pulmonary effort is normal.  Abdominal:     General: There is no distension.     Palpations: Abdomen is soft.     Tenderness: There is abdominal tenderness.     Comments: Cervix 1, high   Genitourinary:     Comments: cerix 1 high no effacement Musculoskeletal:        General: Normal range of motion.     Cervical back: Normal range of motion.  Skin:    General: Skin is warm.  Neurological:     General: No focal deficit present.     Mental Status: She is alert and oriented to person, place, and time.  Psychiatric:        Mood and Affect: Mood normal.     ED Results / Procedures / Treatments   Labs (all labs ordered are listed, but only abnormal results are displayed) Labs Reviewed - No data to display  EKG None  Radiology No results found.  Procedures Procedures (including critical care time)  Medications Ordered in ED Medications - No data to display  ED Course  I have reviewed the triage vital signs and the nursing notes.  Pertinent labs & imaging results that were available during my care of the patient were reviewed by me and considered in my medical decision making (see chart for details).    MDM Rules/Calculators/A&P                          Pt having contractions every 7 minutes.  I will send pt to [facility]  Final Clinical Impression(s) / ED Diagnoses Final diagnoses:  [redacted] weeks gestation of pregnancy    Rx / DC Orders ED Discharge Orders    None    I spoke to Dr. [facility] who advised [facility] ro [facility].  Pt may go private vehiicle    Macon Large 11/13/19 1113    01/13/20, MD 11/13/19 1326

## 2019-11-13 NOTE — Anesthesia Preprocedure Evaluation (Signed)
Anesthesia Evaluation  Patient identified by MRN, date of birth, ID band Patient awake    Reviewed: Allergy & Precautions, NPO status , Patient's Chart, lab work & pertinent test results  History of Anesthesia Complications Negative for: history of anesthetic complications  Airway Mallampati: II   Neck ROM: Full    Dental   Pulmonary asthma , former smoker,    Pulmonary exam normal        Cardiovascular negative cardio ROS Normal cardiovascular exam     Neuro/Psych PSYCHIATRIC DISORDERS Anxiety negative neurological ROS     GI/Hepatic negative GI ROS, (+)     substance abuse  marijuana use,   Endo/Other   Obesity   Renal/GU negative Renal ROS     Musculoskeletal negative musculoskeletal ROS (+)   Abdominal   Peds  Hematology negative hematology ROS (+)  Plt 232k    Anesthesia Other Findings Covid test negative   Reproductive/Obstetrics (+) Pregnancy                             Anesthesia Physical Anesthesia Plan  ASA: II  Anesthesia Plan: Epidural   Post-op Pain Management:    Induction:   PONV Risk Score and Plan: 2 and Treatment may vary due to age or medical condition  Airway Management Planned: Natural Airway  Additional Equipment: None  Intra-op Plan:   Post-operative Plan:   Informed Consent: I have reviewed the patients History and Physical, chart, labs and discussed the procedure including the risks, benefits and alternatives for the proposed anesthesia with the patient or authorized representative who has indicated his/her understanding and acceptance.       Plan Discussed with: Anesthesiologist  Anesthesia Plan Comments: (Labs reviewed. Platelets acceptable, patient not taking any blood thinning medications. Per RN, FHR tracing reported to be stable enough for sitting procedure. Risks and benefits discussed with patient, including PDPH, backache,  epidural hematoma, failed epidural, blood pressure changes, allergic reaction, and nerve injury. Patient expressed understanding and wished to proceed.)        Anesthesia Quick Evaluation

## 2019-11-13 NOTE — Progress Notes (Addendum)
Spoke with Neurosurgeon. Says she will check with the MD for the plan of care. There are no uc's tracing. Says she will  Move  the toco around to be sure it is in a good place on the pt's abd/.

## 2019-11-13 NOTE — Progress Notes (Signed)
Patient arrived to labor and delivery, the right antecubital IV was not present. Patient states it was removed at [facility] prior to being discharged and instructed to come to [facility].

## 2019-11-13 NOTE — ED Notes (Addendum)
Corrie Dandy, [place] [facility] RN notified. Pt has already been placed on EFM. Fetal heart rate around 130's. Pt c/o lower back, buttocks, abdominal intermittent cramping/sharp pain. Pt denies any vaginal leakage, other than small amount of Kareen Jefferys discharge. Trisha Mangle, PA at bedside and performing cervical assessment at this time. O3F2. Pt reports being induced with all of her other deliveries which were all vaginal deliveries. Pt reports normal pregnancy so far. Last OB check up 2 days ago at [facility].

## 2019-11-13 NOTE — H&P (Addendum)
OBSTETRIC ADMISSION HISTORY AND PHYSICAL  Kathy Robertson is a 34 y.o. female [id number] with IUP at [redacted]w[redacted]d by LMP c/w 7wk U/S presenting for IOL for decreased fetal movement. She reports +FMs, No LOF, no VB, no blurry vision, headaches or peripheral edema, and RUQ pain.  She plans on breast feeding. She request BTL for birth control, papers signed. She received her prenatal care at [facility]   Dating: By LMP and 7wk U/S --->  Estimated Date of Delivery: 11/19/19  Sono:  @[redacted]w[redacted]d , CWD, normal anatomy, cephalic presentation, anterior placenta, 273g, 66% EFW  Prenatal History/Complications:  AMA THC+ +Trich, chlamydia during pregnancy with negative TOC Anxiety, not on medications Grand multiparity  Anemia, on PO iron  Past Medical History: Past Medical History:  Diagnosis Date  . Anemia   . Asthma   . Panic attacks anemia    Past Surgical History: Past Surgical History:  Procedure Laterality Date  . foot surgery      Obstetrical History: OB History    Gravida  6   Para  4   Term  4   Preterm      AB  1   Living  4     SAB  1   TAB      Ectopic      Multiple      Live Births  4           Social History Social History   Socioeconomic History  . Marital status: Single    Spouse name: Not on file  . Number of children: Not on file  . Years of education: Not on file  . Highest education level: Not on file  Occupational History  . Not on file  Tobacco Use  . Smoking status: Former  . Smokeless tobacco: Never Used  Vaping Use  . Vaping Use: Never used  Substance and Sexual Activity  . Alcohol use: Not Currently    Comment: occasionally  . Drug use: Yes    Types: Marijuana    Comment: before pregnancy  . Sexual activity: Yes    Birth control/protection: None  Other Topics Concern  . Not on file  Social History Narrative  . Not on file   Social Determinants of Health   Financial Resource Strain:   . Difficulty of Paying Living  Expenses: Not on file  Food Insecurity: Food Insecurity Present  . Worried About Games developer in the Last Year: Never true  . Ran Out of Food in the Last Year: Sometimes true  Transportation Needs: No Transportation Needs  . Lack of Transportation (Medical): No  . Lack of Transportation (Non-Medical): No  Physical Activity: Sufficiently Active  . Days of Exercise per Week: 5 days  . Minutes of Exercise per Session: 30 min  Stress: Stress Concern Present  . Feeling of Stress : Rather much  Social Connections: Moderately Integrated  . Frequency of Communication with Friends and Family: More than three times a week  . Frequency of Social Gatherings with Friends and Family: More than three times a week  . Attends Religious Services: More than 4 times per year  . Active Member of Clubs or Organizations: Yes  . Attends Programme researcher, broadcasting/film/video Meetings: 1 to 4 times per year  . Marital Status: Never married    Family History: Family History  Problem Relation Age of Onset  . Diabetes Father   . Hypertension Father   . Heart attack Father   .  Diabetes Mother   . Hypertension Mother   . Heart failure Mother   . COPD Mother   . Bell's palsy Sister   . Asthma Sister   . Seizures Sister   . Autism Son   . Cancer Maternal Grandmother   . Heart attack Paternal Grandmother     Allergies: Allergies  Allergen Reactions  . Oseltamivir Phosphate Nausea And Vomiting and Other (See Comments)    Tamiflu-Light-headedness    Medications Prior to Admission  Medication Sig Dispense Refill Last Dose  . Ferrous Sulfate (IRON PO) Take by mouth. Gummy-takes 2 daily   11/13/2019 at Unknown time  . pantoprazole (PROTONIX) 20 MG tablet Take 1 tablet (20 mg total) by mouth daily. 30 tablet 3 11/12/2019 at Unknown time  . Prenatal MV-Min-FA-Omega-3 (PRENATAL GUMMIES/DHA & FA PO) Take by mouth.   11/13/2019 at Unknown time  . acetaminophen (TYLENOL) 500 MG tablet Take 1,000 mg by mouth every 8 (eight)  hours as needed for mild pain or moderate pain.  (Patient not taking: Reported on 11/11/2019)     . Blood Pressure Monitor MISC For regular home bp monitoring during pregnancy 1 each 0   . ondansetron (ZOFRAN ODT) 4 MG disintegrating tablet Take 1 tablet (4 mg total) by mouth every 8 (eight) hours as needed for nausea or vomiting. (Patient not taking: Reported on 11/11/2019) 30 tablet 1      Review of Systems   All systems reviewed and negative except as stated in HPI  Blood pressure (!) 117/52, pulse 60, temperature 97.8 F (36.6 C), temperature source Oral, resp. rate 18, last menstrual period 02/12/2019, SpO2 100 %. General appearance: alert, cooperative, appears stated age and no distress Lungs: clear to auscultation bilaterally Heart: regular rate and rhythm Abdomen: soft, non-tender; bowel sounds normal Pelvic: deferred  Extremities: No lower extremity edema, no sign of DVT Presentation: cephalic Fetal monitoringBaseline: 140 bpm, Variability: Good {> 6 bpm), Accelerations: Reactive and Decelerations: Absent Uterine activityFrequency: Every 3-4 minutes, irregular Dilation: 5 Effacement (%): 90 Station: 0 Exam by:: Dr. Barb Merino   Prenatal labs: ABO, Rh: --/--/B POS (09/11 1437) Antibody: NEG (09/11 1437) Rubella: 1.40 (03/10 0959) RPR: Non Reactive (06/16 0855)  HBsAg: Negative (03/10 0959)  HIV: Non Reactive (06/16 0855)  GBS: Negative/-- (08/25 1425)  2 hr GTT: passed 70/133/120 Genetic screening: Normal Anatomy US: Female, isolated LVEICF 2.61mm  Prenatal Transfer Tool  Maternal Diabetes: No Genetic Screening: Normal Maternal Ultrasounds/Referrals: Isolated EIF (echogenic intracardiac focus) Fetal Ultrasounds or other Referrals:  None Maternal Substance Abuse:  Yes:  Type: Marijuana Significant Maternal Medications:  None Significant Maternal Lab Results: Group B Strep negative  Results for orders placed or performed during the hospital encounter of 11/13/19 (from the  past 24 hour(s))  Urinalysis, Routine w reflex microscopic Urine, Clean Catch   Collection Time: 11/13/19  1:09 PM  Result Value Ref Range   Color, Urine YELLOW YELLOW   APPearance HAZY (A) CLEAR   Specific Gravity, Urine 1.020 1.005 - 1.030   pH 8.0 5.0 - 8.0   Glucose, UA NEGATIVE NEGATIVE mg/dL   Hgb urine dipstick SMALL (A) NEGATIVE   Bilirubin Urine NEGATIVE NEGATIVE   Ketones, ur 15 (A) NEGATIVE mg/dL   Protein, ur NEGATIVE NEGATIVE mg/dL   Nitrite NEGATIVE NEGATIVE   Leukocytes,Ua SMALL (A) NEGATIVE  Urinalysis, Microscopic (reflex)   Collection Time: 11/13/19  1:09 PM  Result Value Ref Range   RBC / HPF NONE SEEN 0 - 5 RBC/hpf   WBC,  UA 11-20 0 - 5 WBC/hpf   Bacteria, UA FEW (A) NONE SEEN   Squamous Epithelial / LPF 6-10 0 - 5   Mucus PRESENT   SARS Coronavirus 2 by RT PCR (hospital order, performed in [facility] hospital lab) Nasopharyngeal Nasopharyngeal Swab   Collection Time: 11/13/19  1:44 PM   Specimen: Nasopharyngeal Swab  Result Value Ref Range   SARS Coronavirus 2 NEGATIVE NEGATIVE  CBC   Collection Time: 11/13/19  2:30 PM  Result Value Ref Range   WBC 7.0 4.0 - 10.5 K/uL   RBC 4.02 3.87 - 5.11 MIL/uL   Hemoglobin 12.3 12.0 - 15.0 g/dL   HCT 94.8 36 - 46 %   MCV 95.0 80.0 - 100.0 fL   MCH 30.6 26.0 - 34.0 pg   MCHC 32.2 30.0 - 36.0 g/dL   RDW 01.6 55.3 - 74.8 %   Platelets 232 150 - 400 K/uL   nRBC 0.0 0.0 - 0.2 %  Type and screen [facility]   Collection Time: 11/13/19  2:37 PM  Result Value Ref Range   ABO/RH(D) B POS    Antibody Screen NEG    Sample Expiration      11/16/2019,2359 Performed at [facility], [address], [place], [place] [address]     Patient Active Problem List   Diagnosis Date Noted  . Term pregnancy 11/13/2019  . Marijuana use 05/14/2019  . Encounter for supervision of other normal pregnancy, unspecified trimester 05/12/2019  . AMA (advanced maternal age) multigravida 35+ 05/12/2019  .  Trichomonal vaginitis in pregnancy 05/12/2019  . Request for sterilization 05/12/2019    Assessment/Plan:  Kathy Robertson is a 34 y.o. [id number] at [redacted]w[redacted]d here for IOL for decreased fetal movement  #Labor: Patient with bulging bag on exam. Patient planning for epidural so will allow for placement and then plan for AROM.  #Pain: Planning Epidural #FWB: Cat I, reactive #ID: GBS negative  #MOF: Breast and bottle #MOC: desires BTL, papers signed 6/16 #Circ:  Yes, desires circumcision for infant.  #THC+: SW PP #Trich and Chlamydia: + during pregnancy. Treated with negative test of cure.   #Anxiety: not on medications, stable #Anemia: on PO iron. Hgb 12.3 today. #AMA  Sabino Dick, DO  11/13/2019, 3:44 PM  Attestation of Supervision of Student:  I confirm that I have verified the information documented in the resident's note and that I have also personally reperformed the history, physical exam and all medical decision making activities.  I have verified that all services and findings are accurately documented in this student's note; and I agree with management and plan as outlined in the documentation. I have also made any necessary editorial changes.  Sheila Oats, MD [facility], [facility] 11/13/2019 6:14 PM

## 2019-11-13 NOTE — Progress Notes (Signed)
Labor Progress Note Kathy Robertson is a 34 y.o. [id number] at [redacted]w[redacted]d presented for IOL secondary to decreased fetal movement.  S: Pt reports feeling more comfortable s/p placement of epidural. No concerns at this time.  O:  BP 127/68   Pulse (!) 56   Temp 97.8 F (36.6 C) (Oral)   Resp 16   LMP 02/12/2019 (Exact Date)   SpO2 100% Comment: room air EFM: baseline 120/moderate variability/+accels/no decels  CVE: Dilation: 8 Effacement (%): 70 Station: -1 Presentation: Vertex Exam by:: Dr. Barb Merino   A&P: 34 y.o. [id number] [redacted]w[redacted]d presenting for IOL secondary to decreased fetal movement.  #Labor: Progressing well. S/p AROM for meconium stained fluid @1610 . Will plan for next cervical exam in 2 hours or sooner as clinically indicated. #Pain: epidural in place #FWB: Category 1 strip #GBS negative  #Plan for PP BTL: Papers signed 08/18/19. Pt aware of plan for NPO @[place]  s/p delivery. #Marijuana Use: plan for SW prior to discharge  08/20/19, MD 6:15 PM

## 2019-11-13 NOTE — MAU Note (Signed)
Kathy Robertson is a 34 y.o. at [redacted]w[redacted]d here in MAU reporting: worsening contractions since last night, they are now every few minutes. Saw some mucus bleeding. No LOF. Some DFM.   Onset of complaint: yesterday  Pain score: 9/10  Vitals:   11/13/19 1227  BP: (!) 117/52  Pulse: 60  Resp: 18  Temp: 97.8 F (36.6 C)  SpO2: 100%     FHT: 140  Lab orders placed from triage: none

## 2019-11-13 NOTE — Anesthesia Procedure Notes (Signed)
Epidural Patient location during procedure: OB Start time: 11/13/2019 4:27 PM End time: 11/13/2019 4:30 PM  Staffing Anesthesiologist: Beryle Lathe, MD Performed: anesthesiologist   Preanesthetic Checklist Completed: patient identified, IV checked, risks and benefits discussed, monitors and equipment checked, pre-op evaluation and timeout performed  Epidural Patient position: sitting Prep: DuraPrep Patient monitoring: continuous pulse ox and blood pressure Approach: midline Location: L2-L3 Injection technique: LOR saline  Needle:  Needle type: Tuohy  Needle gauge: 17 G Needle length: 9 cm Needle insertion depth: 8 cm Catheter size: 19 Gauge Catheter at skin depth: 13 cm Test dose: negative and Other (1% lidocaine)  Assessment Events: blood not aspirated  Additional Notes Patient identified. Risks including, but not limited to, bleeding, infection, nerve damage, paralysis, inadequate analgesia, blood pressure changes, nausea, vomiting, allergic reaction, postpartum back pain, itching, and headache were discussed. Patient expressed understanding and wished to proceed. Sterile prep and drape, including hand hygiene, mask, and sterile gloves were used. The patient was positioned and the spine was prepped. The skin was anesthetized with lidocaine. No paraesthesia or other complication noted. The patient did not experience any signs of intravascular injection such as tinnitus or metallic taste in mouth, nor signs of intrathecal spread such as rapid motor block. Please see nursing notes for vital signs. The patient tolerated the procedure well.   Leslye Peer, MDReason for block:procedure for pain

## 2019-11-14 ENCOUNTER — Encounter (HOSPITAL_COMMUNITY)
Admission: AD | Disposition: A | Discharge status: Home / Self Care | Payer: Self-pay | Source: Home / Self Care | Attending: Obstetrics & Gynecology

## 2019-11-14 ENCOUNTER — Other Ambulatory Visit: Payer: Self-pay

## 2019-11-14 ENCOUNTER — Inpatient Hospital Stay (HOSPITAL_COMMUNITY): Payer: Medicaid Other | Admitting: Anesthesiology

## 2019-11-14 ENCOUNTER — Encounter (HOSPITAL_COMMUNITY): Payer: Self-pay | Admitting: Obstetrics & Gynecology

## 2019-11-14 DIAGNOSIS — O368131 Decreased fetal movements, third trimester, fetus 1: Secondary | ICD-10-CM

## 2019-11-14 DIAGNOSIS — Z302 Encounter for sterilization: Secondary | ICD-10-CM

## 2019-11-14 DIAGNOSIS — Z3A39 39 weeks gestation of pregnancy: Secondary | ICD-10-CM

## 2019-11-14 HISTORY — PX: TUBAL LIGATION: SHX77

## 2019-11-14 LAB — CBC
HCT: 33.2 % — ABNORMAL LOW (ref 36.0–46.0)
Hemoglobin: 10.8 g/dL — ABNORMAL LOW (ref 12.0–15.0)
MCH: 30.6 pg (ref 26.0–34.0)
MCHC: 32.5 g/dL (ref 30.0–36.0)
MCV: 94.1 fL (ref 80.0–100.0)
Platelets: 188 10*3/uL (ref 150–400)
RBC: 3.53 MIL/uL — ABNORMAL LOW (ref 3.87–5.11)
RDW: 14 % (ref 11.5–15.5)
WBC: 12.4 10*3/uL — ABNORMAL HIGH (ref 4.0–10.5)
nRBC: 0 % (ref 0.0–0.2)

## 2019-11-14 LAB — RPR
RPR Ser Ql: REACTIVE — AB
RPR Titer: 1:1 {titer}

## 2019-11-14 SURGERY — LIGATION, FALLOPIAN TUBE, POSTPARTUM
Anesthesia: Epidural | Laterality: Bilateral | Wound class: Clean Contaminated

## 2019-11-14 MED ORDER — MAGNESIUM HYDROXIDE 400 MG/5ML PO SUSP: 30.0000 mL | ORAL | Status: DC | PRN

## 2019-11-14 MED ORDER — LIDOCAINE HCL (PF) 2 % IJ SOLN
INTRAMUSCULAR | Status: AC
  Filled 2019-11-14: qty 10

## 2019-11-14 MED ORDER — COCONUT OIL OIL: 1.0000 "application " | TOPICAL_OIL | Status: DC | PRN

## 2019-11-14 MED ORDER — FENTANYL CITRATE (PF) 100 MCG/2ML IJ SOLN
INTRAMUSCULAR | Status: AC
  Filled 2019-11-14: qty 2

## 2019-11-14 MED ORDER — LACTATED RINGERS IV SOLN: INTRAVENOUS | Status: DC

## 2019-11-14 MED ORDER — BUPIVACAINE HCL (PF) 0.25 % IJ SOLN
INTRAMUSCULAR | Status: AC
  Filled 2019-11-14: qty 30

## 2019-11-14 MED ORDER — ONDANSETRON HCL 4 MG PO TABS: 4.0000 mg | ORAL_TABLET | ORAL | Status: DC | PRN

## 2019-11-14 MED ORDER — SODIUM BICARBONATE 8.4 % IV SOLN
INTRAVENOUS | Status: DC | PRN
  Administered 2019-11-14: 10 mL via EPIDURAL
  Administered 2019-11-14: 5 mL via EPIDURAL

## 2019-11-14 MED ORDER — DIPHENHYDRAMINE HCL 25 MG PO CAPS: 25.0000 mg | ORAL_CAPSULE | Freq: Four times a day (QID) | ORAL | Status: DC | PRN

## 2019-11-14 MED ORDER — CALCIUM CARBONATE ANTACID 500 MG PO CHEW
1.0000 | CHEWABLE_TABLET | Freq: Two times a day (BID) | ORAL | Status: DC | PRN
  Administered 2019-11-14: 200 mg via ORAL
  Filled 2019-11-14: qty 1

## 2019-11-14 MED ORDER — OXYCODONE HCL 5 MG/5ML PO SOLN: 5.0000 mg | Freq: Once | ORAL | Status: DC | PRN

## 2019-11-14 MED ORDER — TERBUTALINE SULFATE 1 MG/ML IJ SOLN: 0.2500 mg | Freq: Once | INTRAMUSCULAR | Status: DC | PRN

## 2019-11-14 MED ORDER — IBUPROFEN 600 MG PO TABS
600.0000 mg | ORAL_TABLET | Freq: Four times a day (QID) | ORAL | Status: DC
  Administered 2019-11-14 – 2019-11-15 (×3): 600 mg via ORAL
  Filled 2019-11-14 (×3): qty 1

## 2019-11-14 MED ORDER — SENNOSIDES-DOCUSATE SODIUM 8.6-50 MG PO TABS
2.0000 | ORAL_TABLET | ORAL | Status: DC
  Administered 2019-11-14: 2 via ORAL
  Filled 2019-11-14 (×2): qty 2

## 2019-11-14 MED ORDER — SODIUM CHLORIDE 0.9 % IR SOLN
Status: DC | PRN
  Administered 2019-11-14: 1

## 2019-11-14 MED ORDER — TETANUS-DIPHTH-ACELL PERTUSSIS 5-2.5-18.5 LF-MCG/0.5 IM SUSP: 0.5000 mL | Freq: Once | INTRAMUSCULAR | Status: DC

## 2019-11-14 MED ORDER — KETOROLAC TROMETHAMINE 30 MG/ML IJ SOLN: 30.0000 mg | Freq: Once | INTRAMUSCULAR | Status: DC | PRN

## 2019-11-14 MED ORDER — ACETAMINOPHEN 325 MG PO TABS
650.0000 mg | ORAL_TABLET | ORAL | Status: DC | PRN
  Administered 2019-11-14 – 2019-11-15 (×2): 650 mg via ORAL
  Filled 2019-11-14 (×3): qty 2

## 2019-11-14 MED ORDER — OXYCODONE HCL 5 MG PO TABS: 5.0000 mg | ORAL_TABLET | Freq: Once | ORAL | Status: DC | PRN

## 2019-11-14 MED ORDER — PROMETHAZINE HCL 25 MG/ML IJ SOLN: 6.2500 mg | INTRAMUSCULAR | Status: DC | PRN

## 2019-11-14 MED ORDER — FAMOTIDINE 20 MG PO TABS
40.0000 mg | ORAL_TABLET | Freq: Once | ORAL | Status: AC
  Administered 2019-11-14: 40 mg via ORAL
  Filled 2019-11-14: qty 2

## 2019-11-14 MED ORDER — OXYTOCIN-SODIUM CHLORIDE 30-0.9 UT/500ML-% IV SOLN: 1.0000 m[IU]/min | INTRAVENOUS | Status: DC

## 2019-11-14 MED ORDER — METOCLOPRAMIDE HCL 10 MG PO TABS
10.0000 mg | ORAL_TABLET | Freq: Once | ORAL | Status: AC
  Administered 2019-11-14: 10 mg via ORAL
  Filled 2019-11-14: qty 1

## 2019-11-14 MED ORDER — MEPERIDINE HCL 25 MG/ML IJ SOLN: 6.2500 mg | INTRAMUSCULAR | Status: DC | PRN

## 2019-11-14 MED ORDER — DIBUCAINE (PERIANAL) 1 % EX OINT: 1.0000 "application " | TOPICAL_OINTMENT | CUTANEOUS | Status: DC | PRN

## 2019-11-14 MED ORDER — BUPIVACAINE HCL (PF) 0.25 % IJ SOLN
INTRAMUSCULAR | Status: DC | PRN
  Administered 2019-11-14: 3 mL

## 2019-11-14 MED ORDER — WITCH HAZEL-GLYCERIN EX PADS: 1.0000 "application " | MEDICATED_PAD | CUTANEOUS | Status: DC | PRN

## 2019-11-14 MED ORDER — BENZOCAINE-MENTHOL 20-0.5 % EX AERO: 1.0000 "application " | INHALATION_SPRAY | CUTANEOUS | Status: DC | PRN

## 2019-11-14 MED ORDER — HYDROMORPHONE HCL 1 MG/ML IJ SOLN: 0.2500 mg | INTRAMUSCULAR | Status: DC | PRN

## 2019-11-14 MED ORDER — PRENATAL MULTIVITAMIN CH
1.0000 | ORAL_TABLET | Freq: Every day | ORAL | Status: DC
  Administered 2019-11-15: 1 via ORAL
  Filled 2019-11-14: qty 1

## 2019-11-14 MED ORDER — STERILE WATER FOR IRRIGATION IR SOLN
Status: DC | PRN
  Administered 2019-11-14: 1

## 2019-11-14 MED ORDER — ONDANSETRON HCL 4 MG/2ML IJ SOLN: 4.0000 mg | INTRAMUSCULAR | Status: DC | PRN

## 2019-11-14 MED ORDER — FENTANYL CITRATE (PF) 100 MCG/2ML IJ SOLN
INTRAMUSCULAR | Status: DC | PRN
Start: 2019-11-14 — End: 2019-11-14
  Administered 2019-11-14: 100 ug via EPIDURAL

## 2019-11-14 MED ORDER — SIMETHICONE 80 MG PO CHEW
80.0000 mg | CHEWABLE_TABLET | ORAL | Status: DC | PRN
  Administered 2019-11-14: 80 mg via ORAL
  Filled 2019-11-14: qty 1

## 2019-11-14 MED ORDER — EPINEPHRINE PF 1 MG/ML IJ SOLN
INTRAMUSCULAR | Status: AC
  Filled 2019-11-14: qty 1

## 2019-11-14 SURGICAL SUPPLY — 31 items
CLOTH BEACON ORANGE TIMEOUT ST (SAFETY) ×1 IMPLANT
DRSG OPSITE POSTOP 3X4 (GAUZE/BANDAGES/DRESSINGS) ×1 IMPLANT
DURAPREP 26ML APPLICATOR (WOUND CARE) ×1 IMPLANT
ELECT REM PT RETURN 9FT ADLT (ELECTROSURGICAL) ×2
ELECTRODE REM PT RTRN 9FT ADLT (ELECTROSURGICAL) IMPLANT
GLOVE BIOGEL PI IND STRL 7.0 (GLOVE) IMPLANT
GLOVE BIOGEL PI INDICATOR 7.0 (GLOVE) ×2
GLOVE SURG ORTHO 8.0 STRL STRW (GLOVE) ×1 IMPLANT
GOWN STRL REUS W/TWL LRG LVL3 (GOWN DISPOSABLE) ×2 IMPLANT
NDL SAFETY ECLIPSE 18X1.5 (NEEDLE) IMPLANT
NEEDLE HYPO 18GX1.5 SHARP (NEEDLE) ×2
NEEDLE HYPO 22GX1.5 SAFETY (NEEDLE) ×2 IMPLANT
NS IRRIG 1000ML POUR BTL (IV SOLUTION) ×1 IMPLANT
PACK ABDOMINAL MINOR (CUSTOM PROCEDURE TRAY) ×1 IMPLANT
PAD OB MATERNITY 4.3X12.25 (PERSONAL CARE ITEMS) ×1 IMPLANT
PENCIL SMOKE EVAC W/HOLSTER (ELECTROSURGICAL) ×1 IMPLANT
SET BERKELEY SUCTION TUBING (SUCTIONS) ×1 IMPLANT
SPONGE LAP 18X18 X RAY DECT (DISPOSABLE) ×1 IMPLANT
SUT CHROMIC 1 CTX 36 (SUTURE) ×2 IMPLANT
SUT MNCRL AB 3-0 PS2 27 (SUTURE) ×1 IMPLANT
SUT PLAIN 0 NONE (SUTURE) ×4 IMPLANT
SUT VIC AB 0 CT1 27 (SUTURE) ×2
SUT VIC AB 0 CT1 27XBRD ANBCTR (SUTURE) IMPLANT
SUT VIC AB 2-0 CT1 27 (SUTURE) ×2
SUT VIC AB 2-0 CT1 TAPERPNT 27 (SUTURE) IMPLANT
SYR 30ML LL (SYRINGE) ×1 IMPLANT
SYR CONTROL 10ML LL (SYRINGE) ×2 IMPLANT
TOWEL OR 17X24 6PK STRL BLUE (TOWEL DISPOSABLE) ×1 IMPLANT
TRAY FOLEY W/BAG SLVR 14FR LF (SET/KITS/TRAYS/PACK) ×1 IMPLANT
WATER STERILE IRR 1000ML POUR (IV SOLUTION) ×1 IMPLANT
YANKAUER SUCT BULB TIP NO VENT (SUCTIONS) ×1 IMPLANT

## 2019-11-14 NOTE — Transfer of Care (Signed)
Immediate Anesthesia Transfer of Care Note  Patient: Kathy Robertson  Procedure(s) Performed: POST PARTUM TUBAL LIGATION (Bilateral )  Patient Location: PACU  Anesthesia Type:Epidural  Level of Consciousness: awake, alert  and oriented  Airway & Oxygen Therapy: Patient Spontanous Breathing  Post-op Assessment: Report given to RN and Post -op Vital signs reviewed and stable  Post vital signs: Reviewed and stable  Last Vitals:  Vitals Value Taken Time  BP    Temp    Pulse    Resp    SpO2      Last Pain:  Vitals:   11/14/19 1050  TempSrc: Oral  PainSc: 4          Complications: No complications documented.

## 2019-11-14 NOTE — Discharge Summary (Signed)
Postpartum Discharge Summary    Patient Name: Kathy Robertson DOB: September 29, 1985 MRN: [id number]  Date of admission: 11/13/2019 Delivery date:11/14/2019  Delivering provider: Patriciaann Clan  Date of discharge: 11/15/2019  Admitting diagnosis: Term pregnancy [Z34.90] Intrauterine pregnancy: 105w2d    Secondary diagnosis:  Active Problems:   Encounter for supervision of other normal pregnancy, unspecified trimester   AMA (advanced maternal age) multigravida 35+   Trichomonal vaginitis in pregnancy   Request for sterilization   Marijuana use   Term pregnancy   Vaginal delivery  Additional problems: none    Discharge diagnosis: Term Pregnancy Delivered                                              Post partum procedures:postpartum tubal ligation Augmentation: AROM and Pitocin Complications: None  Hospital course: Induction of Labor With Vaginal Delivery   34y.o. yo [id number] 368w2das admitted to the hospital 11/13/2019 for induction of labor.  Indication for induction: DFM.  Patient had an uncomplicated labor course as follows: Membrane Rupture Time/Date: 11:09 AM ,11/13/2019   Delivery Method:Vaginal, Spontaneous  Episiotomy: None  Lacerations:    Details of delivery can be found in separate delivery note.  Patient had a routine postpartum course. Patient is discharged home 11/15/19.  Newborn Data: Birth date:11/14/2019  Birth time:2:44 AM  Gender:Female  Living status:Living  Apgars:7 ,9  Weight:2889 g   Magnesium Sulfate received: No BMZ received: No Rhophylac:N/A MMR:N/A T-DaP:Given prenatally Flu: No Transfusion:No  Physical exam  Vitals:   11/14/19 1938 11/15/19 0500 11/15/19 0750 11/15/19 1200  BP: 116/70 115/71 100/79 121/75  Pulse: 68 73 65 70  Resp: '16 18 18 18  ' Temp: 98 F (36.7 C) 98.2 F (36.8 C) 98 F (36.7 C) 97.9 F (36.6 C)  TempSrc: Oral Oral Oral Oral  SpO2: 100% 100% 100% 100%   General: alert, cooperative and no distress Lochia:  appropriate Uterine Fundus: firm Incision: Healing well with no significant drainage, No significant erythema, Dressing is clean, dry, and intact DVT Evaluation: No evidence of DVT seen on physical exam. Negative Homan's sign. No cords or calf tenderness. No significant calf/ankle edema. Labs: Lab Results  Component Value Date   WBC 12.4 (H) 11/14/2019   HGB 10.8 (L) 11/14/2019   HCT 33.2 (L) 11/14/2019   MCV 94.1 11/14/2019   PLT 188 11/14/2019   CMP Latest Ref Rng & Units 10/22/2019  Glucose 70 - 99 mg/dL 90  BUN 6 - 20 mg/dL 5(L)  Creatinine 0.44 - 1.00 mg/dL 0.50  Sodium 135 - 145 mmol/L 135  Potassium 3.5 - 5.1 mmol/L 4.0  Chloride 98 - 111 mmol/L 105  CO2 22 - 32 mmol/L 21(L)  Calcium 8.9 - 10.3 mg/dL 8.7(L)  Total Protein 6.5 - 8.1 g/dL 6.7  Total Bilirubin 0.3 - 1.2 mg/dL 0.6  Alkaline Phos 38 - 126 U/L 106  AST 15 - 41 U/L 14(L)  ALT 0 - 44 U/L 11   Edinburgh Score: Edinburgh Postnatal Depression Scale Screening Tool 11/15/2019  I have been able to laugh and see the funny side of things. 0  I have looked forward with enjoyment to things. 1  I have blamed myself unnecessarily when things went wrong. 2  I have been anxious or worried for no good reason. 2  I have felt scared or panicky for no  good reason. 2  Things have been getting on top of me. 2  I have been so unhappy that I have had difficulty sleeping. 2  I have felt sad or miserable. 2  I have been so unhappy that I have been crying. 2  The thought of harming myself has occurred to me. 0  Edinburgh Postnatal Depression Scale Total 15     After visit meds:  Continue BP monitoring Ibuprofen 600 mg every 6 hours prn pain Continue Prenatal Gummies   Discharge home in stable condition Infant Feeding: Breast Infant Disposition:home with mother Discharge instruction: per After Visit Summary and Postpartum booklet. Activity: Advance as tolerated. Pelvic rest for 6 weeks.  Diet: routine diet Future  Appointments: Future Appointments  Date Time Provider [place]  11/17/2019  8:50 AM Darlina Rumpf, CNM [facility]   Follow up Visit:  Follow-up Information    [facility]. Schedule an appointment as soon as possible for a visit in 4 week(s).   Specialty: Obstetrics and Gynecology Why: For postpartum visit Contact information: [address] [place] [phone]               Please schedule this patient for a In person postpartum visit in 4 weeks with the following provider: Any provider. Additional Postpartum F/U:none  Low risk pregnancy complicated by: n/a Delivery mode:  Vaginal, Spontaneous  Anticipated Birth Control:  BTL done [facility]   11/15/2019 Laury Deep, CNM

## 2019-11-14 NOTE — Anesthesia Preprocedure Evaluation (Addendum)
Anesthesia Evaluation  Patient identified by MRN, date of birth, ID band Patient awake    Reviewed: Allergy & Precautions, NPO status , Patient's Chart, lab work & pertinent test results  History of Anesthesia Complications Negative for: history of anesthetic complications  Airway Mallampati: I  TM Distance: >3 FB Neck ROM: Full    Dental  (+) Teeth Intact, Dental Advisory Given   Pulmonary asthma , former smoker,    Pulmonary exam normal        Cardiovascular negative cardio ROS Normal cardiovascular exam     Neuro/Psych PSYCHIATRIC DISORDERS Anxiety negative neurological ROS     GI/Hepatic negative GI ROS, (+)     substance abuse  marijuana use,   Endo/Other  Obesity BMI 34   Renal/GU negative Renal ROS  negative genitourinary   Musculoskeletal negative musculoskeletal ROS (+)   Abdominal (+) + obese,   Peds  Hematology  (+) Blood dyscrasia, anemia , Repeat CBC this AM- H/H 10.8/33.2, plt 188   Anesthesia Other Findings Covid test negative   Reproductive/Obstetrics PPD#0, desires sterility                           Anesthesia Physical Anesthesia Plan  ASA: II  Anesthesia Plan: Epidural   Post-op Pain Management:    Induction:   PONV Risk Score and Plan: 2 and Ondansetron, Dexamethasone and Treatment may vary due to age or medical condition  Airway Management Planned: Natural Airway  Additional Equipment: None  Intra-op Plan:   Post-operative Plan:   Informed Consent: I have reviewed the patients History and Physical, chart, labs and discussed the procedure including the risks, benefits and alternatives for the proposed anesthesia with the patient or authorized representative who has indicated his/her understanding and acceptance.       Plan Discussed with: CRNA  Anesthesia Plan Comments:         Anesthesia Quick Evaluation

## 2019-11-14 NOTE — Progress Notes (Signed)
Pt seen in her room.  She is doing well and has adequate pain control from her epidural.  Risks and benefits of the procedure were discussed.  Patient desires permanent sterilization.  Other reversible forms of contraception were discussed with patient; she declines all other modalities. Risks of procedure discussed with patient including but not limited to: risk of regret, permanence of method, bleeding, infection, injury to surrounding organs and need for additional procedures.  Failure risk of about 1% with increased risk of ectopic gestation if pregnancy occurs was also discussed with patient.  Also discussed possibility of post-tubal pain syndrome. Patient verbalized understanding of these risks and wants to proceed with sterilization.  Written informed consent obtained.  To OR when ready.  O: abdomen: surgical site is adequate Lungs: CTA bilaterally CV: RRR Last h/h 10.7/32.6  A/P:  Multiparity desiring permanent sterility Will proceed with postpartum tubal ligation.  Risks and benefits as above.

## 2019-11-14 NOTE — Discharge Instructions (Signed)

## 2019-11-14 NOTE — Anesthesia Postprocedure Evaluation (Signed)
Anesthesia Post Note  Patient: Kathy Robertson  Procedure(s) Performed: POST PARTUM TUBAL LIGATION (Bilateral )     Patient location during evaluation: PACU Anesthesia Type: Epidural Level of consciousness: oriented and awake and alert Pain management: pain level controlled Vital Signs Assessment: post-procedure vital signs reviewed and stable Respiratory status: spontaneous breathing and respiratory function stable Cardiovascular status: blood pressure returned to baseline and stable Postop Assessment: no headache, no backache, no apparent nausea or vomiting, epidural receding and patient able to bend at knees Anesthetic complications: no   No complications documented.  Last Vitals:  Vitals:   11/14/19 1320 11/14/19 1330  BP: 102/67 116/75  Pulse: 67 70  Resp: 16 19  Temp: 36.5 C   SpO2: 100% 100%    Last Pain:  Vitals:   11/14/19 1330  TempSrc:   PainSc: Asleep   Pain Goal:                Epidural/Spinal Function Cutaneous sensation: Tingles (11/14/19 1320), Patient able to flex knees: Yes (11/14/19 1320), Patient able to lift hips off bed: No (11/14/19 1320), Back pain beyond tenderness at insertion site: No (11/14/19 1320), Progressively worsening motor and/or sensory loss: No (11/14/19 1320), Bowel and/or bladder incontinence post epidural: No (11/14/19 1320)  Tennis Must [place]

## 2019-11-14 NOTE — Progress Notes (Signed)
LABOR PROGRESS NOTE  Kathy Robertson is a 34 y.o. [id number] at [redacted]w[redacted]d  admitted for IOL due to decreased fetal movement.   Subjective: Doing well, not really feeling any contractions. Does occasionally feel pelvic pressure.   Objective: BP 118/70   Pulse 63   Temp 98.2 F (36.8 C) (Oral)   Resp 16   LMP 02/12/2019 (Exact Date)   SpO2 100% Comment: room air or  Vitals:   11/14/19 0000 11/14/19 0030 11/14/19 0040 11/14/19 0100  BP: 127/68 132/75  118/70  Pulse: 64 76  63  Resp:   16   Temp:   98.2 F (36.8 C)   TempSrc:   Oral   SpO2:        Dilation: 7 Effacement (%): 60, 70 Station: -1, 0 Presentation: Vertex Exam by:: Dr. Annia Friendly  FHT: baseline rate 130, moderate varibility, +acel, Early decels with occasional variable  Toco:Every 4 minutes with frequent couplets   Labs: Lab Results  Component Value Date   WBC 7.0 11/13/2019   HGB 12.3 11/13/2019   HCT 38.2 11/13/2019   MCV 95.0 11/13/2019   PLT 232 11/13/2019    Patient Active Problem List   Diagnosis Date Noted  . Term pregnancy 11/13/2019  . Marijuana use 05/14/2019  . Encounter for supervision of other normal pregnancy, unspecified trimester 05/12/2019  . AMA (advanced maternal age) multigravida 35+ 05/12/2019  . Trichomonal vaginitis in pregnancy 05/12/2019  . Request for sterilization 05/12/2019    Assessment / Plan: 34 y.o. [id number] at [redacted]w[redacted]d here for IOL due to decreased fetal movement.   Labor: Minimal progression since last check, will start pit 2x2. Consider IUPC on next check if no change.  Fetal Wellbeing:  Cat 1 majority, has had occasional variable with repositioning improves.  Pain Control:  Epidural in place  Anticipated MOD:  SVD  [name], DO  Family Medicine PGY-3  11/14/2019, 1:21 AM

## 2019-11-14 NOTE — Anesthesia Postprocedure Evaluation (Signed)
Anesthesia Post Note  Patient: Kathy Robertson  Procedure(s) Performed: AN AD HOC LABOR EPIDURAL     Patient location during evaluation: Mother Baby Anesthesia Type: Epidural Level of consciousness: awake and alert Pain management: pain level controlled Vital Signs Assessment: post-procedure vital signs reviewed and stable Respiratory status: spontaneous breathing, nonlabored ventilation and respiratory function stable Cardiovascular status: stable Postop Assessment: no headache, no backache, epidural receding, no apparent nausea or vomiting, patient able to bend at knees, adequate PO intake and able to ambulate Anesthetic complications: no   No complications documented.  Last Vitals:  Vitals:   11/14/19 0453 11/14/19 0631  BP: 114/72 106/87  Pulse: (!) 59   Resp: 17 16  Temp: 36.6 C 36.5 C  SpO2: 100% 100%    Last Pain:  Vitals:   11/14/19 0631  TempSrc: Oral  PainSc: 0-No pain   Pain Goal:                   [employer]

## 2019-11-14 NOTE — Op Note (Addendum)
Kathy Robertson 11/14/2019  PREOPERATIVE DIAGNOSES: Multiparity, undesired fertility  POSTOPERATIVE DIAGNOSES: Multiparity, undesired fertility  PROCEDURE:  Postpartum Bilateral Partial Salpingectomy  SURGEON: Mariel Aloe, MD  ANESTHESIA:  Epidural and local analgesia using 3 ml of 0.25% Marcaine  COMPLICATIONS:  None immediate.  ESTIMATED BLOOD LOSS: 10 ml. IVF: UOP:100 ml INDICATIONS:  34 y.o. [id number] with undesired fertility, status post vaginal delivery, desires permanent sterilization.  Other reversible forms of contraception were discussed with patient; she declines all other modalities. Risks of procedure discussed with patient including but not limited to: risk of regret, permanence of method, bleeding, infection, injury to surrounding organs and need for additional procedures.  Failure risk of 1% with increased risk of ectopic gestation if pregnancy occurs was also discussed with patient.      FINDINGS:  Normal uterus, tubes, and ovaries.  PROCEDURE DETAILS: The patient was taken to the operating room where her epidural anesthesia was dosed up to surgical level and found to be adequate.  She was then placed in the dorsal supine position and prepped and draped in sterile fashion.  After an adequate timeout was performed, attention was turned to the patient's abdomen where a small transverse skin incision was made under the umbilical fold. The incision was taken down to the layer of fascia using the scalpel, and fascia was incised, and extended bilaterally using Mayo scissors. The peritoneum was entered in a blunt fashion. Attention was then turned to the patient's uterus, [place]: The patient's left fallopian tube was then identified, and the Babcock clamp was then used to grasp the tube approximately 3 cm from the cornual region. A 3 cm segment of the tube was then doubly ligated with free tie of plain gut suture, transected and excised. Good hemostasis was noted. The right  fallopian tube was then identified, doubly ligated, and a 3 cm segment excised in a similar fashion allowing for bilateral tubal sterilization. Excellent hemostasis was noted.  Good hemostasis was noted overall. The instruments were then removed from the patient's abdomen and the fascial incision was repaired with 0 Vicryl, and the skin was closed with a 4-0 Vicryl subcuticular stitch. The patient tolerated the procedure well.  Instrument, sponge, and needle counts were correct times two.  The patient was then taken to the recovery room awake and in stable condition.   Mariel Aloe, MD, FACOG Attending Obstetrician & Gynecologist Faculty Practice, [facility]

## 2019-11-15 ENCOUNTER — Encounter (HOSPITAL_COMMUNITY): Payer: Self-pay | Admitting: Obstetrics and Gynecology

## 2019-11-15 LAB — T.PALLIDUM AB, TOTAL: T Pallidum Abs: NONREACTIVE

## 2019-11-15 MED ORDER — IBUPROFEN 600 MG PO TABS: 600.0000 mg | ORAL_TABLET | Freq: Four times a day (QID) | ORAL | 0 refills | Status: AC

## 2019-11-15 MED ORDER — OXYCODONE HCL 5 MG PO TABS
5.0000 mg | ORAL_TABLET | Freq: Once | ORAL | Status: AC
  Administered 2019-11-15: 5 mg via ORAL
  Filled 2019-11-15: qty 1

## 2019-11-15 NOTE — Social Work (Signed)
CSW made CPS report to J. Corine Shelter with [facility] CPS.  No barriers to discharge identified.  Manfred Arch, LCSWA [facility]

## 2019-11-15 NOTE — Progress Notes (Signed)
Post Partum Day 1 Subjective:  Patient is doing well without complaints. Ambulating without difficulty. Voiding and passing flatus. Tolerating PO. Abdominal pain improved. Vaginal bleeding decreased.  Objective: Blood pressure 115/71, pulse 73, temperature 98.2 F (36.8 C), temperature source Oral, resp. rate 18, last menstrual period 02/12/2019, SpO2 100 %, unknown if currently breastfeeding.  Physical Exam:  General: alert, cooperative and no distress Lochia: appropriate Uterine Fundus: firm Incision: honeycomb over umbilicus, c/d/i DVT Evaluation: No evidence of DVT seen on physical exam.  Recent Labs    11/13/19 1430 11/14/19 1137  HGB 12.3 10.8*  HCT 38.2 33.2*    Assessment/Plan: PPD#1 SVD, POD #1 BTL  -doing well, meeting post partum milestones  -breastfeeding  -BTL completed  -desires circ, ordered/consented  Plan for discharge tomorrow.   LOS: 2 days   Alric Seton 11/15/2019, 7:09 AM

## 2019-11-15 NOTE — Progress Notes (Signed)
Patients has a history of asthma.  VIS sheet given for pneumococcal polysaccharide vaccine. Pt encouraged to speak with her provider if she has any questions. She is deciding wether or not she wants the vaccine.

## 2019-11-15 NOTE — Plan of Care (Signed)
All discharge instructions given to patient, patient is receptive to all teaching.

## 2019-11-15 NOTE — Clinical Social Work Maternal (Addendum)
CLINICAL SOCIAL WORK MATERNAL/CHILD NOTE  Patient Details  Name: Kathy Robertson MRN: [id number] Date of Birth: 1985/03/23  Date:  11/15/2019  Clinical Social Worker Initiating Note:  Darra Lis, [place] Date/Time: Initiated:  11/15/19/1000     Child's Name:  Kathy Robertson   Biological Parents:  Mother   Need for Interpreter:  None   Reason for Referral:  Current Substance Use/Substance Use During Pregnancy , Behavioral Health Concerns   Address:  [address] [place] [address]    Phone number:  [phone] (home)     Additional phone number: [phone] (cell)  Household Members/Support Persons (HM/SP):   Household Member/Support Person 1, Household Member/Support Person 2, Household Member/Support Person 3, Household Member/Support Person 4, Household Member/Support Person 5   HM/SP Name Relationship DOB or Age  HM/SP -1 Shawnie Dapper Son 57  HM/SP -2 Destiny Cummings Daughter 85  HM/SP -[place] Daughter 80  HM/SP -4   Adult Brother    HM/SP -5   Adult Son    HM/SP -6        HM/SP -7        HM/SP -8          Natural Supports (not living in the home):  Immediate Family Charity fundraiser)   Professional Supports: Transport planner, Other (Comment) Mining engineer)   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   Education:  [place] arranged:    Museum/gallery curator Resources:  [employer]   Other Resources:  [employer], Physicist, medical    Cultural/Religious Considerations Which May Impact Care:  None  Strengths:  Ability to meet basic needs , Engineer, materials, Home prepared for child    Psychotropic Medications:         Pediatrician:    [place]  Pediatrician List:   [facility]      Pediatrician Fax Number:    Risk Factors/Current Problems:  Family/Relationship Issues , Substance Use ,  Mental Health Concerns    Cognitive State:  Goal Oriented , Insightful , Linear Thinking    Mood/Affect:  Comfortable , Interested , Happy , Calm , Bright , Relaxed    CSW Assessment: CSW consulted due to [facility] use during pregnancy and history of anxiety. CSW met with MOB who was alone, to assess and offer support. CSW introduced self and informed MOB of the reason for visit. MOB expressed understanding. Baby Kathy Robertson was not present in the room during the time of assessment due to being in circumcision procedure.   CSW congratulated MOB and asked how she is currently feeling. MOB expressed she feels good and was observed appearing cheerful. CSW informed MOB of her [place] score of 15 and asked MOB about her if she is currently feeling any anxiety. MOB disclosed she does have a history of anxiety and was feeling anxious when in labor due to FOB being at the hospital and having to be escorted out by security. MOB stated FOB was speaking disrespectfully towards her in the labor room to the nurse. MOB expressed FOB will not be involved in baby's life and she does not understand why he acted that way. CSW was empathetic toward MOB and expressed understanding. CSW asked MOB if she feels safe going home with baby and she stated yes. MOB stated FOB does not live with her and she has  no concerns. MOB expressed anxiety with being pregnant again considering her youngest is 35 years old. MOB stated she was ready to give birth after all of the body changes. MOB went on to disclose to CSW that she has a mental health history of anxiety, PTSD and Bipolar disorder. MOB stated she has had mental health history since the age of 34 years old. MOB stated she was diagnosed with PTSD in Apr 27, 2009 when her mother passed away. CSW inquired on medication and therapy to address mental health needs. MOB stated she goes to [facility] in [place] and sees a therapist once every 3 months. MOB expressed she does not feel that  therapy is helpful but she continues to go because it is required by the court system, in addition to anger management once a week. MOB disclosed the court order is due to legal matters not related to the safety of her children. MOB stated she was taking medication for anxiety prior to the pregnancy and stopped once pregnant. MOB expressed she feels she has been managing well off of the medication. CSW encouraged MOB to reach out to her providers if she feels she could use additional help managing her mental health. MOB declined any current HI, SI or DV. MOB expressed understanding. CSW asked MOB if she would like additional mental health resources and MOB declined. MOB stated her brother and sister are great supports.  CSW informed MOB that prenatal history reports a positive drug screen in March. CSW informed MOB of hospital drug screen policy. CSW informed MOB a UDS, as well as an umbilical cord drug screen will be completed. MOB expressed understanding and denied having any questions. CSW informed MOB that the UDS has resulted positive for THC. CSW asked MOB the time of last use. MOB stated she used THC last week to help with her appetite. MOB denied any other substance use or prescription drug use. CSW informed MOB that a CPS report will be made with [facility]. MOB expressed understanding. CSW asked MOB if there is any previous or current CPS history. MOB stated she had CPS history in February or March of this year for the same reason. MOB stated CPS and her MD was aware of her THC use. MOB declined having any questions about the hospitals drug screen policy.  MOB stated she has all of the essential items needed for baby. MOB stated baby has a brand new carseat and will sleep in a crib once he gets home. Baby will go to [place] for follow-up care. MOB denies having any transportation issues.   CSW provided education regarding the baby blues period vs. perinatal mood disorders,  discussed treatment and gave resources for mental health follow up if concerns arise.  CSW recommends self-evaluation during the postpartum time period using the New Mom Checklist from Postpartum Progress and encouraged MOB to contact a medical professional if symptoms are noted at any time.   CSW provided review of Sudden Infant Death Syndrome (SIDS) precautions.    CSW will make [facility] CPS report for infant's positive UDS and will continue to monitor infants CDS.  CSW identifies no further need for intervention and no barriers to discharge at this time.  Darra Lis, LCSWA [facility] [employer] [facility] [employer]   CSW Plan/Description:  No Further Intervention Required/No Barriers to Discharge, Perinatal Mood and Anxiety Disorder (PMADs) Education, [place], Child Protective Service Report , CSW Will Continue to Monitor Umbilical Cord Tissue  Drug Screen Results and Make Report if Warranted, Sudden Infant Death Syndrome (SIDS) Education    Waylan Boga, Latanya Presser 11/15/2019, 10:44 AM

## 2019-11-16 LAB — SURGICAL PATHOLOGY

## 2019-11-17 ENCOUNTER — Encounter: Payer: Medicaid Other | Admitting: Advanced Practice Midwife

## 2019-11-22 ENCOUNTER — Telehealth: Payer: Self-pay | Admitting: *Deleted

## 2019-11-22 ENCOUNTER — Telehealth: Payer: Self-pay | Admitting: Women's Health

## 2019-11-22 NOTE — Telephone Encounter (Signed)
Pt requesting pain meds sent to pharmacy pt states she is in a lot of pain since delivery

## 2019-11-22 NOTE — Telephone Encounter (Signed)
Patient states she is still experiencing pain and was not sent home with any pain medication.  States she is not taking anything at this time for the pain.  Informed patient that Ibuprofen and Tylenol are the only meds prescribed for this type of procedure.  Advised to start taking Ibuprofen 600mg -800mg  every 6-8 hours and Tylenol 650mg , alternating.  Pt verbalized understanding and agreeable to plan.

## 2019-12-20 ENCOUNTER — Ambulatory Visit: Payer: Medicaid Other | Admitting: Advanced Practice Midwife

## 2020-02-11 ENCOUNTER — Encounter: Payer: Self-pay | Admitting: General Practice

## 2020-03-02 ENCOUNTER — Other Ambulatory Visit: Payer: Self-pay

## 2020-03-02 ENCOUNTER — Emergency Department (HOSPITAL_COMMUNITY)
Admission: EM | Admit: 2020-03-02 | Discharge: 2020-03-02 | Disposition: A | Discharge status: Home / Self Care | Payer: Medicaid Other | Attending: Emergency Medicine | Admitting: Emergency Medicine

## 2020-03-02 ENCOUNTER — Encounter (HOSPITAL_COMMUNITY): Payer: Self-pay | Admitting: Emergency Medicine

## 2020-03-02 DIAGNOSIS — Z87891 Personal history of nicotine dependence: Secondary | ICD-10-CM | POA: Diagnosis not present

## 2020-03-02 DIAGNOSIS — R509 Fever, unspecified: Secondary | ICD-10-CM | POA: Diagnosis present

## 2020-03-02 DIAGNOSIS — U071 COVID-19: Secondary | ICD-10-CM

## 2020-03-02 DIAGNOSIS — J45909 Unspecified asthma, uncomplicated: Secondary | ICD-10-CM | POA: Diagnosis not present

## 2020-03-02 LAB — RESP PANEL BY RT-PCR (FLU A&B, COVID) ARPGX2
Influenza A by PCR: NEGATIVE
Influenza B by PCR: NEGATIVE
SARS Coronavirus 2 by RT PCR: POSITIVE — AB

## 2020-03-02 LAB — POC SARS CORONAVIRUS 2 AG -  ED: SARS Coronavirus 2 Ag: NEGATIVE

## 2020-03-02 MED ORDER — ONDANSETRON 8 MG PO TBDP
8.0000 mg | ORAL_TABLET | Freq: Once | ORAL | Status: AC
  Administered 2020-03-02: 06:00:00 8 mg via ORAL
  Filled 2020-03-02: qty 1

## 2020-03-02 MED ORDER — ACETAMINOPHEN 325 MG PO TABS
650.0000 mg | ORAL_TABLET | Freq: Once | ORAL | Status: AC
  Administered 2020-03-02: 06:00:00 650 mg via ORAL
  Filled 2020-03-02: qty 2

## 2020-03-02 MED ORDER — KETOROLAC TROMETHAMINE 60 MG/2ML IM SOLN
60.0000 mg | Freq: Once | INTRAMUSCULAR | Status: AC
  Administered 2020-03-02: 07:00:00 60 mg via INTRAMUSCULAR
  Filled 2020-03-02: qty 2

## 2020-03-02 NOTE — Discharge Instructions (Addendum)
You are feeling bad because you have a Covid-19 infection.  You need to isolate yourself from everybody until all of your symptoms are gone, or at least 5 days from onset of your symptoms.  For fever, use Tylenol.  For cough or congestion use Robitussin-DM.  Wear a facemask when you are around your child.  Your child can be tested at one of the Covid testing centers.  These are widely available in the community.

## 2020-03-02 NOTE — ED Notes (Signed)
Pt ambulated herself to restroom.

## 2020-03-02 NOTE — ED Triage Notes (Signed)
Pt c/o multiple COVID symptoms. Fever, chills, nasal congestion, and body aches.

## 2020-03-02 NOTE — ED Notes (Signed)
Date and time results received: 03/02/20 1030 (use smartphrase ".now" to insert current time)  Test: Covid Critical Value: Positive  Name of Provider Notified: Effie Shy, MD  Orders Received? Or Actions Taken?:

## 2020-03-02 NOTE — ED Provider Notes (Signed)
10:50 AM-advance testing ordered, her Covid PCR is positive, influenza negative.   Repeat vital signs, completely normal, not requiring oxygen supplementation.  Record review indicates low risk for decompensation with COVID-19 infection.  Patient reported to be sick only 1 day.  I interviewed the patient at 11:10 AM.  This time she is alert, calm and comfortable.  She states her symptoms began at least 48 hours ago with rhinorrhea and got gradually worse yesterday causing her to present overnight for evaluation.  She has a 30-month-old child who she is concerned about exposure to Covid.  I encouraged her to take her child to a testing site or her pediatrician for problems.  Patient advised on symptomatic care, and reasons to return.     Mancel Bale, MD 03/02/20 1118

## 2020-03-02 NOTE — ED Provider Notes (Signed)
[facility] EMERGENCY DEPARTMENT Provider Note   CSN: [id number] Arrival date & time: 03/02/20  0124     History Chief Complaint  Patient presents with  . Covid Exposure    Kathy Robertson is a 34 y.o. female.  The history is provided by the patient.  Cough Severity:  Moderate Onset quality:  Gradual Duration:  1 day Timing:  Intermittent Progression:  Worsening Chronicity:  New Relieved by:  Nothing Worsened by:  Nothing Associated symptoms: chills, fever, headaches and myalgias   Associated symptoms: no shortness of breath   Patient reports cough, chills, fever, headache.  She reports Covid exposure.  She is not vaccinated for COVID-19     Past Medical History:  Diagnosis Date  . Anemia   . Asthma   . Panic attacks anemia    Patient Active Problem List   Diagnosis Date Noted  . Vaginal delivery 11/14/2019  . Term pregnancy 11/13/2019  . Marijuana use 05/14/2019  . Encounter for supervision of other normal pregnancy, unspecified trimester 05/12/2019  . AMA (advanced maternal age) multigravida 35+ 05/12/2019  . Trichomonal vaginitis in pregnancy 05/12/2019  . Request for sterilization 05/12/2019    Past Surgical History:  Procedure Laterality Date  . foot surgery    . TUBAL LIGATION Bilateral 11/14/2019   Procedure: POST PARTUM TUBAL LIGATION;  Surgeon: Warden Fillers, MD;  Location: [facility] LD ORS;  Service: Gynecology;  Laterality: Bilateral;     OB History    Gravida  6   Para  5   Term  5   Preterm      AB  1   Living  5     SAB  1   IAB      Ectopic      Multiple  0   Live Births  5           Family History  Problem Relation Age of Onset  . Diabetes Father   . Hypertension Father   . Heart attack Father   . Diabetes Mother   . Hypertension Mother   . Heart failure Mother   . COPD Mother   . Bell's palsy Sister   . Asthma Sister   . Seizures Sister   . Autism Son   . Cancer Maternal Grandmother   . Heart attack  Paternal Grandmother     Social History   Tobacco Use  . Smoking status: Former Games developer  . Smokeless tobacco: Never Used  Vaping Use  . Vaping Use: Never used  Substance Use Topics  . Alcohol use: Not Currently    Comment: occasionally  . Drug use: Yes    Types: Marijuana    Comment: before pregnancy    Home Medications Prior to Admission medications   Medication Sig Start Date End Date Taking? Authorizing Provider  Blood Pressure Monitor MISC For regular home bp monitoring during pregnancy 05/12/19   Arabella Merles, CNM  ibuprofen (ADVIL) 600 MG tablet Take 1 tablet (600 mg total) by mouth every 6 (six) hours. 11/15/19   Raelyn Mora, CNM  Prenatal MV-Min-FA-Omega-3 (PRENATAL GUMMIES/DHA & FA PO) Take by mouth.    [provider]    Allergies    Oseltamivir phosphate  Review of Systems   Review of Systems  Constitutional: Positive for chills and fever.  Respiratory: Positive for cough. Negative for shortness of breath.   Gastrointestinal: Positive for vomiting.  Genitourinary: Negative for dysuria.  Musculoskeletal: Positive for myalgias.  Neurological: Positive for  headaches.  All other systems reviewed and are negative.   Physical Exam Updated Vital Signs BP 121/73 (BP Location: Right Arm)   Pulse 98   Temp (!) 100.6 F (38.1 C)   Resp 20   Ht 1.702 m (5\' 7" )   Wt 90.7 kg   SpO2 99%   BMI 31.32 kg/m   Physical Exam CONSTITUTIONAL: Well developed/well nourished HEAD: Normocephalic/atraumatic EYES: EOMI/PERRL ENMT: mask in place NECK: supple no meningeal signs SPINE/BACK:entire spine nontender CV: S1/S2 noted, no murmurs/rubs/gallops noted LUNGS: Lungs are clear to auscultation bilaterally, no apparent distress ABDOMEN: soft, nontender  NEURO: Pt is awake/alert/appropriate, moves all extremitiesx4.  No facial droop.   EXTREMITIES: pulses normal/equal, full ROM SKIN: warm, color normal PSYCH: no abnormalities of mood noted, alert and  oriented to situation  ED Results / Procedures / Treatments   Labs (all labs ordered are listed, but only abnormal results are displayed) Labs Reviewed  RESP PANEL BY RT-PCR (FLU A&B, COVID) [id number]  POC SARS CORONAVIRUS 2 AG -  ED    EKG None  Radiology No results found.  Procedures Procedures    Medications Ordered in ED Medications  ketorolac (TORADOL) injection 60 mg (has no administration in time range)  acetaminophen (TYLENOL) tablet 650 mg (650 mg Oral Given 03/02/20 0618)  ondansetron (ZOFRAN-ODT) disintegrating tablet 8 mg (8 mg Oral Given 03/02/20 03/04/20)    ED Course  I have reviewed the triage vital signs and the nursing notes.  Pertinent labs results that were available during my care of the patient were reviewed by me and considered in my medical decision making (see chart for details).    MDM Rules/Calculators/A&P                         7:07 AM Initial antigen test is negative, though still high concern for COVID vs. Flu If COVID positive she might benefit from MAB infusion as outpatient Pt has allergy to tamiflu Signed out to dr [id number] at shift change to f/u on labs   Kathy Robertson was evaluated in Emergency Department on 03/02/2020 for the symptoms described in the history of present illness. She was evaluated in the context of the global COVID-19 pandemic, which necessitated consideration that the patient might be at risk for infection with the SARS-CoV-2 virus that causes COVID-19. Institutional protocols and algorithms that pertain to the evaluation of patients at risk for COVID-19 are in a state of rapid change based on information released by regulatory bodies including the CDC and federal and state organizations. These policies and algorithms were followed during the patient's care in the ED.  Final Clinical Impression(s) / ED Diagnoses Final diagnoses:  None    Rx / DC Orders ED Discharge Orders    None       03/04/2020, MD 03/02/20  [phone]

## 2020-05-21 ENCOUNTER — Other Ambulatory Visit: Payer: Self-pay

## 2020-05-21 ENCOUNTER — Encounter (HOSPITAL_COMMUNITY): Payer: Self-pay | Admitting: Emergency Medicine

## 2020-05-21 ENCOUNTER — Emergency Department (HOSPITAL_COMMUNITY)
Admission: EM | Admit: 2020-05-21 | Discharge: 2020-05-22 | Disposition: A | Discharge status: Home / Self Care | Payer: Medicaid Other | Attending: Emergency Medicine | Admitting: Emergency Medicine

## 2020-05-21 DIAGNOSIS — Z87891 Personal history of nicotine dependence: Secondary | ICD-10-CM | POA: Insufficient documentation

## 2020-05-21 DIAGNOSIS — R000 Tachycardia, unspecified: Secondary | ICD-10-CM | POA: Insufficient documentation

## 2020-05-21 DIAGNOSIS — F419 Anxiety disorder, unspecified: Secondary | ICD-10-CM | POA: Insufficient documentation

## 2020-05-21 DIAGNOSIS — R0682 Tachypnea, not elsewhere classified: Secondary | ICD-10-CM | POA: Insufficient documentation

## 2020-05-21 DIAGNOSIS — J45909 Unspecified asthma, uncomplicated: Secondary | ICD-10-CM | POA: Diagnosis not present

## 2020-05-21 MED ORDER — LORAZEPAM 1 MG PO TABS
2.0000 mg | ORAL_TABLET | Freq: Once | ORAL | Status: AC
  Administered 2020-05-22: 2 mg via ORAL
  Filled 2020-05-21: qty 2

## 2020-05-21 NOTE — ED Triage Notes (Signed)
Pt here stating she is having a panic attack. Pt tachypneic and crying during triage. Difficult to obtain much information as a result.

## 2020-05-22 MED ORDER — LORAZEPAM 1 MG PO TABS: 1.0000 mg | ORAL_TABLET | Freq: Three times a day (TID) | ORAL | 0 refills | Status: AC | PRN

## 2020-05-22 NOTE — ED Provider Notes (Signed)
[facility] EMERGENCY DEPARTMENT Provider Note   CSN: [id number] Arrival date & time: 05/21/20  2327     History Chief Complaint  Patient presents with  . Panic Attack    Kathy Robertson is a 35 y.o. female.  Here with a self-described anxiety attack.  She states that it started just prior to arrival.  She states that she takes multiple anxiety depression medications at home.  She never had an anxiety attack at this before.  No exacerbating factors but she cannot get to stop and so she presents here.  Not able to give much more history.     Past Medical History:  Diagnosis Date  . Anemia   . Asthma   . Panic attacks anemia    Patient Active Problem List   Diagnosis Date Noted  . Vaginal delivery 11/14/2019  . Term pregnancy 11/13/2019  . Marijuana use 05/14/2019  . Encounter for supervision of other normal pregnancy, unspecified trimester 05/12/2019  . AMA (advanced maternal age) multigravida 35+ 05/12/2019  . Trichomonal vaginitis in pregnancy 05/12/2019  . Request for sterilization 05/12/2019    Past Surgical History:  Procedure Laterality Date  . foot surgery    . TUBAL LIGATION Bilateral 11/14/2019   Procedure: POST PARTUM TUBAL LIGATION;  Surgeon: Warden Fillers, MD;  Location: [facility] LD ORS;  Service: Gynecology;  Laterality: Bilateral;     OB History    Gravida  6   Para  5   Term  5   Preterm      AB  1   Living  5     SAB  1   IAB      Ectopic      Multiple  0   Live Births  5           Family History  Problem Relation Age of Onset  . Diabetes Father   . Hypertension Father   . Heart attack Father   . Diabetes Mother   . Hypertension Mother   . Heart failure Mother   . COPD Mother   . Bell's palsy Sister   . Asthma Sister   . Seizures Sister   . Autism Son   . Cancer Maternal Grandmother   . Heart attack Paternal Grandmother     Social History   Tobacco Use  . Smoking status: Former Games developer  . Smokeless tobacco:  Never Used  Vaping Use  . Vaping Use: Never used  Substance Use Topics  . Alcohol use: Not Currently    Comment: occasionally  . Drug use: Yes    Types: Marijuana    Comment: before pregnancy    Home Medications Prior to Admission medications   Medication Sig Start Date End Date Taking? Authorizing Provider  Blood Pressure Monitor MISC For regular home bp monitoring during pregnancy 05/12/19   Arabella Merles, CNM  ibuprofen (ADVIL) 600 MG tablet Take 1 tablet (600 mg total) by mouth every 6 (six) hours. 11/15/19   Raelyn Mora, CNM  Prenatal MV-Min-FA-Omega-3 (PRENATAL GUMMIES/DHA & FA PO) Take by mouth.    [provider]    Allergies    Oseltamivir phosphate  Review of Systems   Review of Systems  All other systems reviewed and are negative.   Physical Exam Updated Vital Signs BP (!) 133/98   Pulse (!) 108   Temp 98.1 F (36.7 C) (Oral)   Resp (!) 32   Ht 5\' 7"  (1.702 m)   Wt 91 kg  SpO2 100%   BMI 31.42 kg/m   Physical Exam Vitals and nursing note reviewed.  Constitutional:      Appearance: She is well-developed.  HENT:     Head: Normocephalic and atraumatic.  Cardiovascular:     Rate and Rhythm: Regular rhythm. Tachycardia present.  Pulmonary:     Effort: Tachypnea present. No respiratory distress.     Breath sounds: No stridor.  Abdominal:     General: There is no distension.  Musculoskeletal:     Cervical back: Normal range of motion.  Neurological:     Mental Status: She is alert.  Psychiatric:        Mood and Affect: Affect is tearful.        Speech: She is noncommunicative (likely related to crying and tachypnea).     ED Results / Procedures / Treatments   Labs (all labs ordered are listed, but only abnormal results are displayed) Labs Reviewed - No data to display  EKG None  Radiology No results found.  Procedures Procedures   Medications Ordered in ED Medications  LORazepam (ATIVAN) tablet 2 mg (2 mg Oral Given  05/22/20 0000)    ED Course  I have reviewed the triage vital signs and the nursing notes.  Pertinent labs & imaging results that were available during my care of the patient were reviewed by me and considered in my medical decision making (see chart for details).    MDM Rules/Calculators/A&P                         Will try to get her to calm down and relax then obtain more history. Will at least need to obtain an ecg when more relaxed.   Checked on patient. Feeling well. Sleeping. Will continue to monitor.   Multiple reevaluations and patient resting comfortably.   Patient ultimately awoken. Ambulates. Tolerates PO. No complaints. Likely anxiety. Is calling for a ride. pcp follow up.  Final Clinical Impression(s) / ED Diagnoses Final diagnoses:  None    Rx / DC Orders ED Discharge Orders    None       Mesner, Barbara Cower, MD 05/31/20 [phone]

## 2020-11-07 ENCOUNTER — Encounter (HOSPITAL_COMMUNITY): Payer: Self-pay | Admitting: Emergency Medicine

## 2020-11-07 ENCOUNTER — Emergency Department (HOSPITAL_COMMUNITY)
Admission: EM | Admit: 2020-11-07 | Discharge: 2020-11-07 | Disposition: A | Discharge status: Home / Self Care | Payer: Medicaid Other | Attending: Emergency Medicine | Admitting: Emergency Medicine

## 2020-11-07 ENCOUNTER — Other Ambulatory Visit: Payer: Self-pay

## 2020-11-07 ENCOUNTER — Emergency Department (HOSPITAL_COMMUNITY): Payer: Medicaid Other

## 2020-11-07 DIAGNOSIS — Z87891 Personal history of nicotine dependence: Secondary | ICD-10-CM | POA: Diagnosis not present

## 2020-11-07 DIAGNOSIS — J45909 Unspecified asthma, uncomplicated: Secondary | ICD-10-CM | POA: Diagnosis not present

## 2020-11-07 DIAGNOSIS — R059 Cough, unspecified: Secondary | ICD-10-CM | POA: Diagnosis present

## 2020-11-07 DIAGNOSIS — Z20822 Contact with and (suspected) exposure to covid-19: Secondary | ICD-10-CM | POA: Diagnosis not present

## 2020-11-07 DIAGNOSIS — J069 Acute upper respiratory infection, unspecified: Secondary | ICD-10-CM | POA: Diagnosis not present

## 2020-11-07 DIAGNOSIS — R0602 Shortness of breath: Secondary | ICD-10-CM

## 2020-11-07 LAB — RESP PANEL BY RT-PCR (FLU A&B, COVID) ARPGX2
Influenza A by PCR: NEGATIVE
Influenza B by PCR: NEGATIVE
SARS Coronavirus 2 by RT PCR: NEGATIVE

## 2020-11-07 MED ORDER — PREDNISONE 50 MG PO TABS
60.0000 mg | ORAL_TABLET | Freq: Once | ORAL | Status: AC
  Administered 2020-11-07: 60 mg via ORAL
  Filled 2020-11-07: qty 1

## 2020-11-07 MED ORDER — ALBUTEROL SULFATE HFA 108 (90 BASE) MCG/ACT IN AERS
2.0000 | INHALATION_SPRAY | Freq: Once | RESPIRATORY_TRACT | Status: AC
  Administered 2020-11-07: 2 via RESPIRATORY_TRACT
  Filled 2020-11-07: qty 6.7

## 2020-11-07 NOTE — ED Provider Notes (Signed)
[facility]-EMERGENCY DEPT [facility] Emergency Department Provider Note MRN:  [id number]  Arrival date & time: 11/07/20     Chief Complaint   Cough   History of Present Illness   Kathy Robertson is a 35 y.o. year-old female with no pertinent past medical history presenting to the ED with chief complaint of cough.  4 days of cough, sore throat, malaise, and now noticing some shortness of breath when she is laying flat trying to sleep.  History of asthma but ran out of her inhalers a while ago.  Denies chest pain, no fever, no abdominal pain, no other complaints.  Symptoms are constant, moderate, no other exacerbating or alleviating factors.  Review of Systems  A complete 10 system review of systems was obtained and all systems are negative except as noted in the HPI and PMH.   Patient's Health History    Past Medical History:  Diagnosis Date   Anemia    Asthma    Panic attacks anemia    Past Surgical History:  Procedure Laterality Date   foot surgery     TUBAL LIGATION Bilateral 11/14/2019   Procedure: POST PARTUM TUBAL LIGATION;  Surgeon: Warden Fillers, MD;  Location: [facility] LD ORS;  Service: Gynecology;  Laterality: Bilateral;    Family History  Problem Relation Age of Onset   Diabetes Father    Hypertension Father    Heart attack Father    Diabetes Mother    Hypertension Mother    Heart failure Mother    COPD Mother    Bell's palsy Sister    Asthma Sister    Seizures Sister    Autism Son    Cancer Maternal Grandmother    Heart attack Paternal Grandmother     Social History   Socioeconomic History   Marital status: Single    Spouse name: Not on file   Number of children: Not on file   Years of education: Not on file   Highest education level: Not on file  Occupational History   Not on file  Tobacco Use   Smoking status: Former   Smokeless tobacco: Never  Vaping Use   Vaping Use: Never used  Substance and Sexual Activity   Alcohol use: Not Currently     Comment: occasionally   Drug use: Yes    Types: Marijuana    Comment: before pregnancy   Sexual activity: Yes    Birth control/protection: None  Other Topics Concern   Not on file  Social History Narrative   Not on file   Social Determinants of Health   Financial Resource Strain: Not on file  Food Insecurity: Not on file  Transportation Needs: Not on file  Physical Activity: Not on file  Stress: Not on file  Social Connections: Not on file  Intimate Partner Violence: Not on file     Physical Exam   Vitals:   11/07/20 0236  BP: 117/75  Pulse: 76  Resp: 20  Temp: 97.8 F (36.6 C)  SpO2: 96%    CONSTITUTIONAL: Well-appearing, NAD NEURO:  Alert and oriented x 3, no focal deficits EYES:  eyes equal and reactive ENT/NECK:  no LAD, no JVD CARDIO: Regular rate, well-perfused, normal S1 and S2 PULM: Scattered wheezes GI/GU:  normal bowel sounds, non-distended, non-tender MSK/SPINE:  No gross deformities, no edema SKIN:  no rash, atraumatic PSYCH:  Appropriate speech and behavior  *Additional and/or pertinent findings included in MDM below  Diagnostic and Interventional Summary    EKG  Interpretation  Date/Time:    Ventricular Rate:    PR Interval:    QRS Duration:   QT Interval:    QTC Calculation:   R Axis:     Text Interpretation:         Labs Reviewed  RESP PANEL BY RT-PCR (FLU A&B, COVID) ARPGX2    DG Chest Port 1 View  Final Result      Medications  predniSONE (DELTASONE) tablet 60 mg (has no administration in time range)  albuterol (VENTOLIN HFA) 108 (90 Base) MCG/ACT inhaler 2 puff (2 puffs Inhalation Given 11/07/20 0307)     Procedures  /  Critical Care Procedures  ED Course and Medical Decision Making  I have reviewed the triage vital signs, the nursing notes, and pertinent available records from the EMR.  Listed above are laboratory and imaging tests that I personally ordered, reviewed, and interpreted and then considered in my medical  decision making (see below for details).  Suspect viral illness triggering a mild asthma exacerbation, providing breathing treatment and will reassess.  Patient is not vaccinated for COVID and so this is also a consideration.     Patient doing much better after breathing treatment, appropriate for discharge.  Elmer Sow. Pilar Plate, MD [facility] Emergency Medicine [facility] [email or web address]  Final Clinical Impressions(s) / ED Diagnoses     ICD-10-CM   1. Upper respiratory tract infection, unspecified type  J06.9     2. SOB (shortness of breath)  R06.02 DG Chest [facility] 1 View    DG Chest [place] 1 View    3. Mild asthma without complication, unspecified whether persistent  J45.909       ED Discharge Orders     None        Discharge Instructions Discussed with and Provided to Patient:     Discharge Instructions      You were evaluated in the Emergency Department and after careful evaluation, we did not find any emergent condition requiring admission or further testing in the hospital.  Your exam/testing today was overall reassuring.  Symptoms seem to be due to a viral infection causing a flare of your asthma.  Please take the inhaler at home every 4 hours as needed for wheezing and follow-up with your regular doctor.  If your COVID test result is positive, recommend continued home isolation for 4 more days.  Please return to the Emergency Department if you experience any worsening of your condition.  Thank you for allowing [place] to be a part of your care.         Sabas Sous, MD 11/07/20 [phone]

## 2020-11-07 NOTE — ED Triage Notes (Signed)
Pt c/o sob, cough, and sore throat x 4 days.

## 2020-11-07 NOTE — Discharge Instructions (Addendum)
You were evaluated in the Emergency Department and after careful evaluation, we did not find any emergent condition requiring admission or further testing in the hospital.  Your exam/testing today was overall reassuring.  Symptoms seem to be due to a viral infection causing a flare of your asthma.  Please take the inhaler at home every 4 hours as needed for wheezing and follow-up with your regular doctor.  If your COVID test result is positive, recommend continued home isolation for 4 more days.  Please return to the Emergency Department if you experience any worsening of your condition.  Thank you for allowing [place] to be a part of your care.

## 2021-12-17 IMAGING — DX DG CHEST 1V PORT
1 series · 1 of 1 positions shown · non-contrast
Comparison: 10/22/2019

CLINICAL DATA: Dyspnea, cough, sore throat

EXAM:
PORTABLE CHEST 1 VIEW

[chest ap]
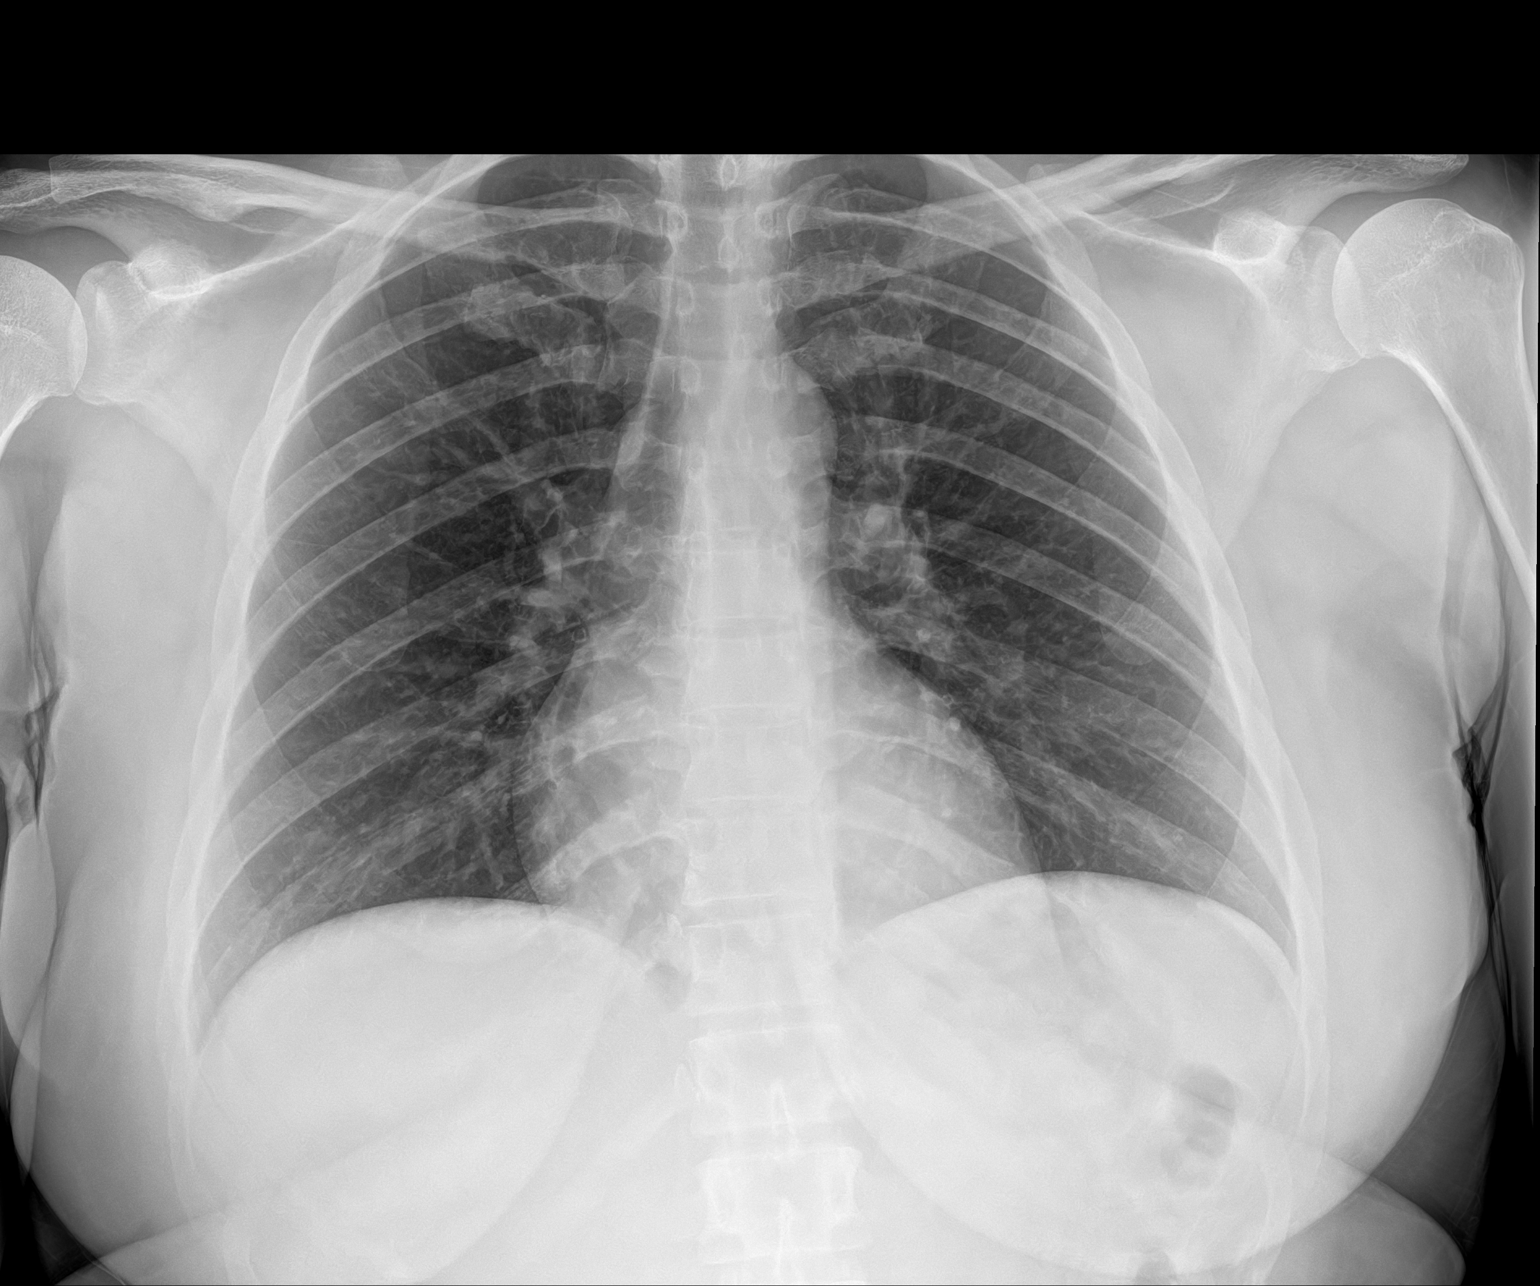

[1 of 1 positions shown; findings below may reference images not displayed]

FINDINGS: The heart size and mediastinal contours are within normal limits.
Both lungs are clear. The visualized skeletal structures are
unremarkable.
IMPRESSION: No active disease.

## 2022-02-19 ENCOUNTER — Ambulatory Visit: Payer: Self-pay | Admitting: Adult Health

## 2022-09-08 ENCOUNTER — Emergency Department (HOSPITAL_COMMUNITY)
Admission: EM | Admit: 2022-09-08 | Discharge: 2022-09-08 | Disposition: A | Discharge status: Home / Self Care | Payer: Self-pay | Attending: Emergency Medicine | Admitting: Emergency Medicine

## 2022-09-08 ENCOUNTER — Encounter (HOSPITAL_COMMUNITY): Payer: Self-pay

## 2022-09-08 ENCOUNTER — Other Ambulatory Visit: Payer: Self-pay

## 2022-09-08 DIAGNOSIS — M7989 Other specified soft tissue disorders: Secondary | ICD-10-CM | POA: Insufficient documentation

## 2022-09-08 DIAGNOSIS — Z203 Contact with and (suspected) exposure to rabies: Secondary | ICD-10-CM | POA: Insufficient documentation

## 2022-09-08 DIAGNOSIS — Z23 Encounter for immunization: Secondary | ICD-10-CM | POA: Insufficient documentation

## 2022-09-08 DIAGNOSIS — W540XXA Bitten by dog, initial encounter: Secondary | ICD-10-CM | POA: Insufficient documentation

## 2022-09-08 DIAGNOSIS — S81831A Puncture wound without foreign body, right lower leg, initial encounter: Secondary | ICD-10-CM | POA: Insufficient documentation

## 2022-09-08 MED ORDER — ONDANSETRON 4 MG PO TBDP
4.0000 mg | ORAL_TABLET | Freq: Once | ORAL | Status: AC
  Administered 2022-09-08: 4 mg via ORAL
  Filled 2022-09-08: qty 1

## 2022-09-08 MED ORDER — AMOXICILLIN-POT CLAVULANATE 875-125 MG PO TABS: 1.0000 | ORAL_TABLET | Freq: Two times a day (BID) | ORAL | 0 refills | Status: AC

## 2022-09-08 MED ORDER — RABIES VACCINE, PCEC IM SUSR
1.0000 mL | Freq: Once | INTRAMUSCULAR | Status: AC
  Administered 2022-09-08: 1 mL via INTRAMUSCULAR
  Filled 2022-09-08: qty 1

## 2022-09-08 MED ORDER — OXYCODONE-ACETAMINOPHEN 5-325 MG PO TABS
2.0000 | ORAL_TABLET | Freq: Once | ORAL | Status: AC
  Administered 2022-09-08: 2 via ORAL
  Filled 2022-09-08: qty 2

## 2022-09-08 MED ORDER — AMOXICILLIN-POT CLAVULANATE 875-125 MG PO TABS
1.0000 | ORAL_TABLET | Freq: Once | ORAL | Status: AC
  Administered 2022-09-08: 1 via ORAL
  Filled 2022-09-08: qty 1

## 2022-09-08 MED ORDER — RABIES IMMUNE GLOBULIN 150 UNIT/ML IM INJ
20.0000 [IU]/kg | INJECTION | Freq: Once | INTRAMUSCULAR | Status: AC
  Administered 2022-09-08: 1875 [IU]
  Filled 2022-09-08 (×2): qty 14

## 2022-09-08 MED ORDER — TETANUS-DIPHTH-ACELL PERTUSSIS 5-2.5-18.5 LF-MCG/0.5 IM SUSY
0.5000 mL | PREFILLED_SYRINGE | Freq: Once | INTRAMUSCULAR | Status: AC
  Administered 2022-09-08: 0.5 mL via INTRAMUSCULAR
  Filled 2022-09-08: qty 0.5

## 2022-09-08 NOTE — ED Notes (Signed)
Per protocol and policy, [facility] [employer] has been notified. Pt unable to provide an accurate address of her location at the time of the incident, unable to provide information if the dog is vaccinated, and unsure who the owner of the dog is.

## 2022-09-08 NOTE — ED Provider Notes (Signed)
[place] EMERGENCY DEPARTMENT AT [facility] Provider Note   CSN: [id number] Arrival date & time: 09/08/22  0235     History  Chief Complaint  Patient presents with   Animal Bite    Kathy Robertson is a 37 y.o. female.  Bit on right lower leg posteriorly by an unknown dog that is not in custody. Unknown tetanus.    Animal Bite      Home Medications Prior to Admission medications   Medication Sig Start Date End Date Taking? Authorizing Provider  amoxicillin-clavulanate (AUGMENTIN) 875-125 MG tablet Take 1 tablet by mouth every 12 (twelve) hours. 09/08/22  Yes Liese Dizdarevic, Barbara Cower, MD  Blood Pressure Monitor MISC For regular home bp monitoring during pregnancy 05/12/19   Arabella Merles, CNM  ibuprofen (ADVIL) 600 MG tablet Take 1 tablet (600 mg total) by mouth every 6 (six) hours. 11/15/19   Raelyn Mora, CNM  LORazepam (ATIVAN) 1 MG tablet Take 1 tablet (1 mg total) by mouth 3 (three) times daily as needed for anxiety. 05/22/20   Shahzad Thomann, Barbara Cower, MD  Prenatal MV-Min-FA-Omega-3 (PRENATAL GUMMIES/DHA & FA PO) Take by mouth.    [provider]      Allergies    Oseltamivir phosphate    Review of Systems   Review of Systems  Physical Exam Updated Vital Signs BP 122/82 (BP Location: Right Arm)   Pulse 85   Temp 98.2 F (36.8 C) (Oral)   Resp 18   Ht 5\' 7"  (1.702 m)   Wt 93 kg   SpO2 99%   BMI 32.11 kg/m  Physical Exam Vitals and nursing note reviewed.  Constitutional:      Appearance: She is well-developed.  HENT:     Head: Normocephalic and atraumatic.  Cardiovascular:     Rate and Rhythm: Normal rate and regular rhythm.  Pulmonary:     Effort: No respiratory distress.     Breath sounds: No stridor.  Abdominal:     General: There is no distension.  Musculoskeletal:     Cervical back: Normal range of motion.  Skin:    Comments: A couple of abrasions, one small superficial laceration and one puncture wound over right mid gastroc area. NVI  distally. Localized swelling. Hemostatic at this time.   Neurological:     General: No focal deficit present.     Mental Status: She is alert.     ED Results / Procedures / Treatments   Labs (all labs ordered are listed, but only abnormal results are displayed) Labs Reviewed - No data to display  EKG None  Radiology No results found.  Procedures Procedures    Medications Ordered in ED Medications  rabies immune globulin (HYPERRAB/KEDRAB) injection 1,875 Units (1,875 Units Infiltration Given 09/08/22 0356)  rabies vaccine (RABAVERT) injection 1 mL (1 mL Intramuscular Given 09/08/22 0354)  Tdap (BOOSTRIX) injection 0.5 mL (0.5 mLs Intramuscular Given 09/08/22 0353)  amoxicillin-clavulanate (AUGMENTIN) 875-125 MG per tablet 1 tablet (1 tablet Oral Given 09/08/22 0351)  oxyCODONE-acetaminophen (PERCOCET/ROXICET) 5-325 MG per tablet 2 tablet (2 tablets Oral Given 09/08/22 0351)  ondansetron (ZOFRAN-ODT) disintegrating tablet 4 mg (4 mg Oral Given 09/08/22 0502)    ED Course/ Medical Decision Making/ A&P                             Medical Decision Making Risk Prescription drug management.   Rabies vaccine and Ig. Augmentin. Pain meds. TDAP. Wound care per nursing.  Instructions for repeat rabies vaccines provided. Antibiotics and few pain pills provided.    Final Clinical Impression(s) / ED Diagnoses Final diagnoses:  Dog bite, initial encounter    Rx / DC Orders ED Discharge Orders          Ordered    amoxicillin-clavulanate (AUGMENTIN) 875-125 MG tablet  Every 12 hours        09/08/22 0415              Haley Fuerstenberg, Barbara Cower, MD 09/08/22 [phone]

## 2022-09-08 NOTE — ED Triage Notes (Signed)
Pt arrived via POV c/o dog bite to posterior RLE. Two puncture wounds present in Triage and bleeding is not controlled. Pt reports she does not know the dog, the dog broke free from it's chain and then the dog took off running. Pt does not know the owner of the dog.
